# Patient Record
Sex: Male | Born: 1960 | Race: White | Hispanic: No | Marital: Married | Smoking: Never smoker
Health system: Southern US, Community
[De-identification: ages and names within clinical notes are randomized; demographics above are authoritative.]

## PROBLEM LIST (undated history)

## (undated) DIAGNOSIS — K5700 Diverticulitis of small intestine with perforation and abscess without bleeding: Secondary | ICD-10-CM

## (undated) DIAGNOSIS — J45909 Unspecified asthma, uncomplicated: Secondary | ICD-10-CM

## (undated) DIAGNOSIS — M1990 Unspecified osteoarthritis, unspecified site: Secondary | ICD-10-CM

## (undated) HISTORY — DX: Diverticulitis of small intestine with perforation and abscess without bleeding: K57.00

## (undated) HISTORY — PX: HERNIA REPAIR: SHX51

## (undated) HISTORY — PX: TONSILLECTOMY: SUR1361

---

## 2016-04-13 ENCOUNTER — Other Ambulatory Visit: Payer: Self-pay | Admitting: Orthopedic Surgery

## 2016-04-13 DIAGNOSIS — M1711 Unilateral primary osteoarthritis, right knee: Secondary | ICD-10-CM

## 2016-04-13 DIAGNOSIS — M2391 Unspecified internal derangement of right knee: Secondary | ICD-10-CM

## 2016-05-16 ENCOUNTER — Ambulatory Visit
Admission: RE | Admit: 2016-05-16 | Discharge: 2016-05-16 | Disposition: A | Discharge status: Home / Self Care | Payer: BLUE CROSS/BLUE SHIELD | Source: Ambulatory Visit | Attending: Orthopedic Surgery | Admitting: Orthopedic Surgery

## 2016-05-16 DIAGNOSIS — M1711 Unilateral primary osteoarthritis, right knee: Secondary | ICD-10-CM | POA: Diagnosis present

## 2016-05-16 DIAGNOSIS — M659 Synovitis and tenosynovitis, unspecified: Secondary | ICD-10-CM | POA: Insufficient documentation

## 2016-05-16 DIAGNOSIS — M2391 Unspecified internal derangement of right knee: Secondary | ICD-10-CM

## 2016-06-05 ENCOUNTER — Inpatient Hospital Stay
Admission: EM | Admit: 2016-06-05 | Discharge: 2016-06-08 | DRG: 392 | Disposition: A | Discharge status: Home / Self Care | Payer: BLUE CROSS/BLUE SHIELD | Attending: General Surgery | Admitting: General Surgery

## 2016-06-05 ENCOUNTER — Encounter: Payer: Self-pay | Admitting: Emergency Medicine

## 2016-06-05 ENCOUNTER — Emergency Department: Payer: BLUE CROSS/BLUE SHIELD

## 2016-06-05 DIAGNOSIS — K5720 Diverticulitis of large intestine with perforation and abscess without bleeding: Secondary | ICD-10-CM | POA: Diagnosis present

## 2016-06-05 DIAGNOSIS — J45909 Unspecified asthma, uncomplicated: Secondary | ICD-10-CM | POA: Diagnosis present

## 2016-06-05 DIAGNOSIS — Z882 Allergy status to sulfonamides status: Secondary | ICD-10-CM | POA: Diagnosis not present

## 2016-06-05 DIAGNOSIS — Z880 Allergy status to penicillin: Secondary | ICD-10-CM

## 2016-06-05 DIAGNOSIS — Z881 Allergy status to other antibiotic agents status: Secondary | ICD-10-CM

## 2016-06-05 DIAGNOSIS — R1031 Right lower quadrant pain: Secondary | ICD-10-CM | POA: Diagnosis not present

## 2016-06-05 HISTORY — DX: Unspecified asthma, uncomplicated: J45.909

## 2016-06-05 LAB — COMPREHENSIVE METABOLIC PANEL
ALBUMIN: 3.9 g/dL (ref 3.5–5.0)
ALK PHOS: 45 U/L (ref 38–126)
ALT: 18 U/L (ref 17–63)
ANION GAP: 9 (ref 5–15)
AST: 27 U/L (ref 15–41)
BILIRUBIN TOTAL: 2.5 mg/dL — AB (ref 0.3–1.2)
BUN: 18 mg/dL (ref 6–20)
CALCIUM: 8.5 mg/dL — AB (ref 8.9–10.3)
CO2: 25 mmol/L (ref 22–32)
Chloride: 104 mmol/L (ref 101–111)
Creatinine, Ser: 1.18 mg/dL (ref 0.61–1.24)
GFR calc Af Amer: >60 mL/min (ref >60)
GFR calc non Af Amer: >60 mL/min (ref >60)
GLUCOSE: 147 mg/dL — AB (ref 65–99)
Potassium: 3.6 mmol/L (ref 3.5–5.1)
Sodium: 138 mmol/L (ref 135–145)
TOTAL PROTEIN: 7.2 g/dL (ref 6.5–8.1)

## 2016-06-05 LAB — CBC
HCT: 39.5 % — ABNORMAL LOW (ref 40.0–52.0)
Hemoglobin: 14 g/dL (ref 13.0–18.0)
MCH: 32.9 pg (ref 26.0–34.0)
MCHC: 35.5 g/dL (ref 32.0–36.0)
MCV: 92.7 fL (ref 80.0–100.0)
Platelets: 217 10*3/uL (ref 150–440)
RBC: 4.26 MIL/uL — ABNORMAL LOW (ref 4.40–5.90)
RDW: 12.8 % (ref 11.5–14.5)
WBC: 13.7 10*3/uL — AB (ref 3.8–10.6)

## 2016-06-05 LAB — TROPONIN I: Troponin I: <0.03 ng/mL (ref <0.03)

## 2016-06-05 LAB — URINALYSIS, COMPLETE (UACMP) WITH MICROSCOPIC
Bacteria, UA: NONE SEEN
Bilirubin Urine: NEGATIVE
Glucose, UA: NEGATIVE mg/dL
HGB URINE DIPSTICK: NEGATIVE
Ketones, ur: 20 mg/dL — AB
Leukocytes, UA: NEGATIVE
NITRITE: NEGATIVE
PH: 6 (ref 5.0–8.0)
Protein, ur: 100 mg/dL — AB
Specific Gravity, Urine: 1.032 — ABNORMAL HIGH (ref 1.005–1.030)
Squamous Epithelial / LPF: NONE SEEN

## 2016-06-05 LAB — LACTIC ACID, PLASMA: LACTIC ACID, VENOUS: 1.4 mmol/L (ref 0.5–1.9)

## 2016-06-05 LAB — LIPASE, BLOOD: Lipase: 23 U/L (ref 11–51)

## 2016-06-05 MED ORDER — IOPAMIDOL (ISOVUE-300) INJECTION 61%
100.0000 mL | Freq: Once | INTRAVENOUS | Status: AC | PRN
  Administered 2016-06-05: 100 mL via INTRAVENOUS

## 2016-06-05 MED ORDER — CIPROFLOXACIN IN D5W 400 MG/200ML IV SOLN
400.0000 mg | Freq: Once | INTRAVENOUS | Status: AC
  Administered 2016-06-05: 400 mg via INTRAVENOUS
  Filled 2016-06-05: qty 200

## 2016-06-05 MED ORDER — DIPHENHYDRAMINE HCL 25 MG PO CAPS
25.0000 mg | ORAL_CAPSULE | Freq: Four times a day (QID) | ORAL | Status: DC | PRN
  Administered 2016-06-06: 25 mg via ORAL
  Filled 2016-06-05: qty 1

## 2016-06-05 MED ORDER — SODIUM CHLORIDE 0.9 % IV BOLUS (SEPSIS)
1000.0000 mL | Freq: Once | INTRAVENOUS | Status: AC
  Administered 2016-06-05: 1000 mL via INTRAVENOUS

## 2016-06-05 MED ORDER — MORPHINE SULFATE (PF) 4 MG/ML IV SOLN
4.0000 mg | INTRAVENOUS | Status: DC | PRN
Start: 2016-06-05 — End: 2016-06-08
  Administered 2016-06-06: 4 mg via INTRAVENOUS

## 2016-06-05 MED ORDER — SODIUM CHLORIDE 0.9 % IV BOLUS (SEPSIS): 1000.0000 mL | Freq: Once | INTRAVENOUS | Status: DC

## 2016-06-05 MED ORDER — ACETAMINOPHEN 500 MG PO TABS
1000.0000 mg | ORAL_TABLET | Freq: Once | ORAL | Status: AC
  Administered 2016-06-05: 1000 mg via ORAL
  Filled 2016-06-05: qty 2

## 2016-06-05 MED ORDER — METRONIDAZOLE IN NACL 5-0.79 MG/ML-% IV SOLN
500.0000 mg | Freq: Once | INTRAVENOUS | Status: AC
  Administered 2016-06-05: 500 mg via INTRAVENOUS
  Filled 2016-06-05: qty 100

## 2016-06-05 MED ORDER — HYDRALAZINE HCL 20 MG/ML IJ SOLN: 10.0000 mg | INTRAMUSCULAR | Status: DC | PRN

## 2016-06-05 MED ORDER — DIPHENHYDRAMINE HCL 50 MG/ML IJ SOLN
25.0000 mg | Freq: Four times a day (QID) | INTRAMUSCULAR | Status: DC | PRN
  Administered 2016-06-06: 25 mg via INTRAVENOUS
  Filled 2016-06-05: qty 1

## 2016-06-05 MED ORDER — ONDANSETRON 4 MG PO TBDP: 4.0000 mg | ORAL_TABLET | Freq: Four times a day (QID) | ORAL | Status: DC | PRN

## 2016-06-05 MED ORDER — MORPHINE SULFATE (PF) 4 MG/ML IV SOLN
4.0000 mg | INTRAVENOUS | Status: DC | PRN
  Administered 2016-06-05: 4 mg via INTRAVENOUS
  Filled 2016-06-05 (×2): qty 1

## 2016-06-05 MED ORDER — SODIUM CHLORIDE 0.9 % IV BOLUS (SEPSIS)
1000.0000 mL | Freq: Once | INTRAVENOUS | Status: AC
Start: 2016-06-05 — End: 2016-06-05
  Administered 2016-06-05: 1000 mL via INTRAVENOUS

## 2016-06-05 MED ORDER — ENOXAPARIN SODIUM 40 MG/0.4ML ~~LOC~~ SOLN
40.0000 mg | SUBCUTANEOUS | Status: DC
  Administered 2016-06-05 – 2016-06-07 (×3): 40 mg via SUBCUTANEOUS
  Filled 2016-06-05 (×3): qty 0.4

## 2016-06-05 MED ORDER — CIPROFLOXACIN IN D5W 400 MG/200ML IV SOLN
400.0000 mg | Freq: Two times a day (BID) | INTRAVENOUS | Status: DC
  Administered 2016-06-06 – 2016-06-08 (×5): 400 mg via INTRAVENOUS
  Filled 2016-06-05 (×6): qty 200

## 2016-06-05 MED ORDER — KETOROLAC TROMETHAMINE 30 MG/ML IJ SOLN
30.0000 mg | Freq: Four times a day (QID) | INTRAMUSCULAR | Status: DC | PRN
  Administered 2016-06-06 – 2016-06-07 (×3): 30 mg via INTRAVENOUS
  Filled 2016-06-05 (×3): qty 1

## 2016-06-05 MED ORDER — ONDANSETRON HCL 4 MG/2ML IJ SOLN
4.0000 mg | Freq: Four times a day (QID) | INTRAMUSCULAR | Status: DC | PRN
  Administered 2016-06-06: 4 mg via INTRAVENOUS
  Filled 2016-06-05: qty 2

## 2016-06-05 MED ORDER — ACETAMINOPHEN 10 MG/ML IV SOLN
1000.0000 mg | Freq: Four times a day (QID) | INTRAVENOUS | Status: AC | PRN
  Filled 2016-06-05: qty 100

## 2016-06-05 MED ORDER — LACTATED RINGERS IV SOLN
INTRAVENOUS | Status: DC
  Administered 2016-06-05 – 2016-06-08 (×7): via INTRAVENOUS

## 2016-06-05 MED ORDER — PROMETHAZINE HCL 25 MG/ML IJ SOLN
12.5000 mg | Freq: Once | INTRAMUSCULAR | Status: DC
Start: 2016-06-05 — End: 2016-06-08

## 2016-06-05 MED ORDER — METRONIDAZOLE IN NACL 5-0.79 MG/ML-% IV SOLN
500.0000 mg | Freq: Three times a day (TID) | INTRAVENOUS | Status: DC
  Administered 2016-06-06 – 2016-06-08 (×8): 500 mg via INTRAVENOUS
  Filled 2016-06-05 (×9): qty 100

## 2016-06-05 NOTE — H&P (Signed)
Patient ID: DE KOPKA, male   DOB: 11/10/1960, 56 y.o.   MRN: [id number]  CC: Abdominal pain  HPI Peter Hicks is a 56 y.o. male presents to the ER today with a 2 day history of abdominal pain. The pain is progressively worsened during that time and he started to run a fever today which prompted him to seek medical attention. He's never had anything like this before. The pain is always been in his lower abdomen. He denies any nausea, vomiting, chest pain, shortness of breath, diarrhea, constipation. The pain comes in waves and is described as a warm ache to his left lower quadrant. Patient is otherwise in his usual state of excellent health.  HPI  Past Medical History:  Diagnosis Date  . Asthma     Past Surgical History:  Procedure Laterality Date  . HERNIA REPAIR      Family history: Grandmother with pancreatic cancer. No known history of heart disease or diabetes.  Social History Social History  Substance Use Topics  . Smoking status: Never Smoker  . Smokeless tobacco: Never Used  . Alcohol use 2.4 oz/week    4 Cans of beer per week    Allergies  Allergen Reactions  . Cleocin [Clindamycin Hcl]   . Penicillins Hives    Has patient had a PCN reaction causing immediate rash, facial/tongue/throat swelling, SOB or lightheadedness with hypotension: No Has patient had a PCN reaction causing severe rash involving mucus membranes or skin necrosis: {No Has patient had a PCN reaction that required hospitalization No Has patient had a PCN reaction occurring within the last 10 years: No If all of the above answers are "NO", then may proceed with Cephalosporin use.   . Sulfa Antibiotics     Current Facility-Administered Medications  Medication Dose Route Frequency Provider Last Rate Last Dose  . ciprofloxacin (CIPRO) IVPB 400 mg  400 mg Intravenous Once Merlyn Lot, MD 200 mL/hr at 06/05/16 2008 400 mg at 06/05/16 2008  . metroNIDAZOLE (FLAGYL) IVPB 500 mg  500 mg  Intravenous Once Merlyn Lot, MD 100 mL/hr at 06/05/16 2009 500 mg at 06/05/16 2009  . morphine 4 MG/ML injection 4 mg  4 mg Intravenous Q3H PRN Merlyn Lot, MD      . promethazine (PHENERGAN) injection 12.5 mg  12.5 mg Intravenous Once Merlyn Lot, MD      . sodium chloride 0.9 % bolus 1,000 mL  1,000 mL Intravenous Once Merlyn Lot, MD 2,000 mL/hr at 06/05/16 2008 1,000 mL at 06/05/16 2008   And  . sodium chloride 0.9 % bolus 1,000 mL  1,000 mL Intravenous Once Merlyn Lot, MD       No current outpatient prescriptions on file.     Review of Systems A Multi-point review of systems was asked and was negative except for the findings I found in the history of present illness   Physical Exam Blood pressure 130/84, pulse 100, temperature (!) 103.2 F (39.6 C), temperature source Oral, resp. rate 18, height 6' (1.829 m), weight 96.2 kg (212 lb), SpO2 97 %. CONSTITUTIONAL: Resting in bed in no acute distress. EYES: Pupils are equal, round, and reactive to light, Sclera are non-icteric. EARS, NOSE, MOUTH AND THROAT: The oropharynx is clear. The oral mucosa is pink and moist. Hearing is intact to voice. LYMPH NODES:  Lymph nodes in the neck are normal. RESPIRATORY:  Lungs are clear. There is normal respiratory effort, with equal breath sounds bilaterally, and without pathologic use of accessory muscles.  CARDIOVASCULAR: Heart is regular without murmurs, gallops, or rubs. GI: The abdomen is soft, tender to palpation left lower quadrant, and nondistended. There are no palpable masses. There is no hepatosplenomegaly. There are normal bowel sounds in all quadrants. No rebound or guarding on exam GU: Rectal deferred.   MUSCULOSKELETAL: Normal muscle strength and tone. No cyanosis or edema.   SKIN: Turgor is good and there are no pathologic skin lesions or ulcers. NEUROLOGIC: Motor and sensation is grossly normal. Cranial nerves are grossly intact. PSYCH:  Oriented to person,  place and time. Affect is normal.  Data Reviewed Images and labs reviewed, labs significant for a leukocytosis of 13.7, CT scan shows evidence of active sigmoid diverticulitis with a small amount of air adjacent to the sigmoid colon consistent with a microperforation. Thickening of the small bowel adjacent to this that is likely reactive. No evidence of abscess. I have personally reviewed the patient's imaging, laboratory findings and medical records.    Assessment    Diverticulitis with microperforation    Plan    56 year old male with sigmoid diverticulitis with microperforation. Discussed the diagnosis in detail with the patient and his wife. Plan for admission, nothing by mouth, IV fluids, IV antibiotics, serial exams. Discussed with the patient of the majority of time patient's improve on antibiotics and are able to avoid an urgent operation. However, given his febrile state and microperforation there is a chance he could require an urgent operation. Patient voiced understanding and accepts the plan for admission.     Time spent with the patient was 50 minutes, with more than 50% of the time spent in face-to-face education, counseling and care coordination.     Clayburn Pert, MD [facility]  [facility] [phone] [facility] [phone] 06/05/2016, 8:19 PM

## 2016-06-05 NOTE — ED Provider Notes (Signed)
[facility] Emergency Department Provider Note    First MD Initiated Contact with Patient 06/05/16 1816     (approximate)  I have reviewed the triage vital signs and the nursing notes.   HISTORY  Chief Complaint Abdominal Pain    HPI Peter Hicks is a 56 y.o. male who presents with 2 days of worsening lower abdominal pain since her fever and nausea and anorexia. Patient states the current pain is 3 out of 10 in severity. States it radiates across his suprapubic region.  No previous abd surgeries. Is not on any blood thinners. Denies any history of heart disease. No pulmonary disease. States he does have a headache.   Past Medical History:  Diagnosis Date  . Asthma    History reviewed. No pertinent family history. Past Surgical History:  Procedure Laterality Date  . HERNIA REPAIR     Patient Active Problem List   Diagnosis Date Noted  . Diverticulitis of large intestine with perforation without abscess or bleeding 06/05/2016      Prior to Admission medications   Medication Sig Start Date End Date Taking? Authorizing Provider  ibuprofen (ADVIL,MOTRIN) 200 MG tablet Take 200 mg by mouth every 6 (six) hours as needed.   Yes Historical Provider, MD  meloxicam (MOBIC) 15 MG tablet Take 15 mg by mouth daily. 04/13/16  Yes Historical Provider, MD  traMADol (ULTRAM) 50 MG tablet Take 50 mg by mouth every 6 (six) hours as needed for pain. 05/05/16  Yes Historical Provider, MD    Allergies Cleocin [clindamycin hcl]; Penicillins; and Sulfa antibiotics    Social History Social History  Substance Use Topics  . Smoking status: Never Smoker  . Smokeless tobacco: Never Used  . Alcohol use 2.4 oz/week    4 Cans of beer per week    Review of Systems Patient denies headaches, rhinorrhea, blurry vision, numbness, shortness of breath, chest pain, edema, cough, abdominal pain, nausea, vomiting, diarrhea, dysuria, fevers, rashes or hallucinations  unless otherwise stated above in HPI. ____________________________________________   PHYSICAL EXAM:  VITAL SIGNS: Vitals:   06/05/16 1930 06/05/16 2000  BP: 132/77 130/84  Pulse: 82 100  Resp:    Temp:      Constitutional: Alert and oriented. Ill appearing Eyes: Conjunctivae are normal. PERRL. EOMI. Head: Atraumatic. Nose: No congestion/rhinnorhea. Mouth/Throat: Mucous membranes are moist.  Oropharynx non-erythematous. Neck: No stridor. Painless ROM. No cervical spine tenderness to palpation Hematological/Lymphatic/Immunilogical: No cervical lymphadenopathy. Cardiovascular: Normal rate, regular rhythm. Grossly normal heart sounds.  Good peripheral circulation. Respiratory: Normal respiratory effort.  No retractions. Lungs CTAB. Gastrointestinal: Soft  With generalized peritonitis to suprapubic region, + rebound tenderness in RLQ.  No hernia,  Genitourinary:  Musculoskeletal: No lower extremity tenderness nor edema.  No joint effusions. Neurologic:  Normal speech and language. No gross focal neurologic deficits are appreciated. No gait instability. Skin:  Skin is warm, dry and intact. No rash noted. Psychiatric: Mood and affect are normal. Speech and behavior are normal.  ____________________________________________   LABS (all labs ordered are listed, but only abnormal results are displayed)  Results for orders placed or performed during the hospital encounter of 06/05/16 (from the past 24 hour(s))  Lipase, blood     Status: None   Collection Time: 06/05/16  6:16 PM  Result Value Ref Range   Lipase 23 11 - 51 U/L  Comprehensive metabolic panel     Status: Abnormal   Collection Time: 06/05/16  6:16 PM  Result Value Ref Range  Sodium 138 135 - 145 mmol/L   Potassium 3.6 3.5 - 5.1 mmol/L   Chloride 104 101 - 111 mmol/L   CO2 25 22 - 32 mmol/L   Glucose, Bld 147 (H) 65 - 99 mg/dL   BUN 18 6 - 20 mg/dL   Creatinine, Ser 1.18 0.61 - 1.24 mg/dL   Calcium 8.5 (L) 8.9 - 10.3  mg/dL   Total Protein 7.2 6.5 - 8.1 g/dL   Albumin 3.9 3.5 - 5.0 g/dL   AST 27 15 - 41 U/L   ALT 18 17 - 63 U/L   Alkaline Phosphatase 45 38 - 126 U/L   Total Bilirubin 2.5 (H) 0.3 - 1.2 mg/dL   GFR calc non Af Amer >60 >60 mL/min   GFR calc Af Amer >60 >60 mL/min   Anion gap 9 5 - 15  CBC     Status: Abnormal   Collection Time: 06/05/16  6:16 PM  Result Value Ref Range   WBC 13.7 (H) 3.8 - 10.6 K/uL   RBC 4.26 (L) 4.40 - 5.90 MIL/uL   Hemoglobin 14.0 13.0 - 18.0 g/dL   HCT 39.5 (L) 40.0 - 52.0 %   MCV 92.7 80.0 - 100.0 fL   MCH 32.9 26.0 - 34.0 pg   MCHC 35.5 32.0 - 36.0 g/dL   RDW 12.8 11.5 - 14.5 %   Platelets 217 150 - 440 K/uL  Troponin I     Status: None   Collection Time: 06/05/16  6:16 PM  Result Value Ref Range   Troponin I <0.03 <0.03 ng/mL  Lactic acid, plasma     Status: None   Collection Time: 06/05/16  6:26 PM  Result Value Ref Range   Lactic Acid, Venous 1.4 0.5 - 1.9 mmol/L   ____________________________________________  EKG ____________________________________________  RADIOLOGY  I personally reviewed all radiographic images ordered to evaluate for the above acute complaints and reviewed radiology reports and findings.  These findings were personally discussed with the patient.  Please see medical record for radiology report.  ____________________________________________   PROCEDURES  Procedure(s) performed:  Procedures    Critical Care performed: no ____________________________________________   INITIAL IMPRESSION / ASSESSMENT AND PLAN / ED COURSE  Pertinent labs & imaging results that were available during my care of the patient were reviewed by me and considered in my medical decision making (see chart for details).  DDX: .appy, abscess, perf, stone, pyelo, bacteremia  Peter Hicks is a 56 y.o. who presents to the ED with Abdominal pain and fever with tachycardia as described above.  Based on his peritoneal abdomen upright  abdominal x-ray ordered to evaluate for perforated viscous. There is no evidence of free air. Therefore in the setting of his acute leukocytosis with concern for appendicitis, diverticulitis or abscess CT imaging ordered to evaluate.  The patient will be placed on continuous pulse oximetry and telemetry for monitoring.  Laboratory evaluation will be sent to evaluate for the above complaints.     Clinical Course as of Jun 06 2031  Molli Knock Jun 05, 2016  2008 CT imaging shows evidence of acute diverticulitis without evidence of abscess. There is evidence of microperforation.  Given his leukocytosis with peritonitis and sepsis I do feel the patient will require admission to the hospital. I spoke with Dr. Adonis Huguenin who kindly agrees to admit patient for further evaluation and management.  Have discussed with the patient and available family all diagnostics and treatments performed thus far and all questions were answered to  the best of my ability. The patient demonstrates understanding and agreement with plan.   [PR]    Clinical Course User Index [PR] Peter Lot, MD     ____________________________________________   FINAL CLINICAL IMPRESSION(S) / ED DIAGNOSES  Final diagnoses:  Acute abdominal pain in right lower quadrant  Diverticulitis of large intestine with perforation without abscess or bleeding      NEW MEDICATIONS STARTED DURING THIS VISIT:  New Prescriptions   No medications on file     Note:  This document was prepared using Dragon voice recognition software and may include unintentional dictation errors.    Peter Lot, MD 06/05/16 2034

## 2016-06-05 NOTE — ED Triage Notes (Signed)
Pt via ems from PCP office with lower abdominal pain since Saturday. He went to pcp office today and had ultrasound. He was told her had a ruptured appendix or almost ruptured appendix. Pt states his pain is 2/10. Pt alert & oriented with NAD noted.

## 2016-06-05 NOTE — ED Notes (Signed)
AAOx3.  Skin warm and dry.  Denies C/O pain or nausea.  Continue to monitor.

## 2016-06-06 LAB — CBC
HEMATOCRIT: 34.7 % — AB (ref 40.0–52.0)
Hemoglobin: 12.2 g/dL — ABNORMAL LOW (ref 13.0–18.0)
MCH: 32.7 pg (ref 26.0–34.0)
MCHC: 35.2 g/dL (ref 32.0–36.0)
MCV: 92.9 fL (ref 80.0–100.0)
PLATELETS: 174 10*3/uL (ref 150–440)
RBC: 3.74 MIL/uL — ABNORMAL LOW (ref 4.40–5.90)
RDW: 12.5 % (ref 11.5–14.5)
WBC: 10.4 10*3/uL (ref 3.8–10.6)

## 2016-06-06 LAB — BASIC METABOLIC PANEL
ANION GAP: 5 (ref 5–15)
BUN: 15 mg/dL (ref 6–20)
CALCIUM: 7.8 mg/dL — AB (ref 8.9–10.3)
CO2: 24 mmol/L (ref 22–32)
CREATININE: 0.94 mg/dL (ref 0.61–1.24)
Chloride: 111 mmol/L (ref 101–111)
GFR calc Af Amer: >60 mL/min (ref >60)
GLUCOSE: 113 mg/dL — AB (ref 65–99)
Potassium: 3.6 mmol/L (ref 3.5–5.1)
Sodium: 140 mmol/L (ref 135–145)

## 2016-06-06 MED ORDER — ACETAMINOPHEN 325 MG PO TABS
650.0000 mg | ORAL_TABLET | ORAL | Status: DC | PRN
  Administered 2016-06-06: 650 mg via ORAL
  Filled 2016-06-06: qty 2

## 2016-06-06 NOTE — Progress Notes (Signed)
CC: Perforated diverticulitis Subjective: This a patient with a history of perforated diverticulitis. He was admitted to the hospital last night. He has no improvement but no worsening this morning no nausea vomiting no fevers or chills points to the left lower quadrant as source of his pain. Passing gas  Objective: Vital signs in last 24 hours: Temp:  [98 F (36.7 C)-103.2 F (39.6 C)] 98 F (36.7 C) (01/30 0513) Pulse Rate:  [62-118] 66 (01/30 0513) Resp:  [17-28] 18 (01/30 0513) BP: (112-132)/(60-84) 112/67 (01/30 0513) SpO2:  [96 %-100 %] 98 % (01/30 0513) Weight:  [212 lb (96.2 kg)-213 lb 3.2 oz (96.7 kg)] 213 lb 3.2 oz (96.7 kg) (01/29 2204) Last BM Date: 06/05/16  Intake/Output from previous day: 01/29 0701 - 01/30 0700 In: 861 [I.V.:761; IV Piggyback:100] Out: 450 [Urine:450] Intake/Output this shift: No intake/output data recorded.  Physical exam:  Afebrile vital signs are stable Awake alert and oriented Abdomen is soft and minimally distended moderately tender in the left lower quadrant with some guarding but no rebound or percussion tenderness. Calves are nontender  Lab Results: CBC   Recent Labs  06/05/16 1816 06/06/16 0454  WBC 13.7* 10.4  HGB 14.0 12.2*  HCT 39.5* 34.7*  PLT 217 174   BMET  Recent Labs  06/05/16 1816 06/06/16 0454  NA 138 140  K 3.6 3.6  CL 104 111  CO2 25 24  GLUCOSE 147* 113*  BUN 18 15  CREATININE 1.18 0.94  CALCIUM 8.5* 7.8*   PT/INR No results for input(s): LABPROT, INR in the last 72 hours. ABG No results for input(s): PHART, HCO3 in the last 72 hours.  Invalid input(s): PCO2, PO2  Studies/Results: Dg Abdomen 1 View  Result Date: 06/05/2016 CLINICAL DATA:  Lower abdominal pain since Saturday. Recent diagnosis of appendicitis. EXAM: ABDOMEN - 1 VIEW COMPARISON:  None. FINDINGS: Single upright KUB is provided. Lower abdomen and pelvis is excluded. Visualized bowel gas pattern is nonobstructive. No evidence of soft  tissue mass or abnormal fluid collection is seen. No evidence of free intraperitoneal air. Lung bases appear clear. No acute or suspicious osseous finding. Mild scoliosis of the thoracolumbar spine. IMPRESSION: 1. No evidence of acute intra-abdominal abnormality. Nonobstructive bowel gas pattern. 2. Lower abdomen and pelvis is excluded on this plain film examination. Electronically Signed   By: Franki Cabot M.D.   On: 06/05/2016 18:57   Ct Abdomen Pelvis W Contrast  Result Date: 06/05/2016 CLINICAL DATA:  Acute lower abdominal pain since Saturday. EXAM: CT ABDOMEN AND PELVIS WITH CONTRAST TECHNIQUE: Multidetector CT imaging of the abdomen and pelvis was performed using the standard protocol following bolus administration of intravenous contrast. CONTRAST:  118mL ISOVUE-300 IOPAMIDOL (ISOVUE-300) INJECTION 61% COMPARISON:  None. FINDINGS: Lower chest: Minor dependent basilar atelectasis. Mild cardiomegaly. No pericardial or pleural effusion. Small hiatal hernia. Degenerative changes of the lower thoracic spine. Hepatobiliary: No focal liver abnormality is seen. No gallstones, gallbladder wall thickening, or biliary dilatation. Pancreas: Unremarkable. No pancreatic ductal dilatation or surrounding inflammatory changes. Spleen: Normal in size without focal abnormality. Adrenals/Urinary Tract: Adrenal glands are unremarkable. Kidneys are normal, without renal calculi, focal lesion, or hydronephrosis. Bladder is unremarkable. Stomach/Bowel: Negative for significant dilatation, obstruction pattern or abscess. In the pelvis, there is midline lower abdominopelvic strandy inflammation/edema about the sigmoid colon and the adjacent terminal ileum. Diverticulosis is evident of the sigmoid. There is a tiny focus of extra luminal air suspected, image 72 adjacent to the sigmoid colon and terminal ileum. Appearance is  compatible with sigmoid diverticulitis and suspected micro perforation. Adjacent terminal ileum demonstrates  inflammation / mild wall thickening, suspect reactive enteritis. Portions of the appendix are demonstrated along the right iliac vessels appearing unremarkable. Trace dependent pelvic free fluid inferiorly, image 84. Vascular/Lymphatic: Minor aortic atherosclerosis and tortuosity. No acute vascular process. No adenopathy. Reproductive: Prostate calcifications noted. Seminal vesicles prostate normal in size. No acute finding. Other: No inguinal abnormality. Negative for hernia. Intact abdominal wall. Musculoskeletal: Degenerative changes of the spine diffusely. No acute osseous finding. IMPRESSION: Acute sigmoid diverticulitis with a small adjacent micro perforation suspected. Mild wall thickening/ prominence of the terminal ileum, suspect reactive enteritis from the adjacent inflammation. Trace pelvic free fluid related to the inflammatory process. Negative for abscess, or obstruction Electronically Signed   By: Jerilynn Mages.  Shick M.D.   On: 06/05/2016 19:36    Anti-infectives: Anti-infectives    Start     Dose/Rate Route Frequency Ordered Stop   06/06/16 0800  ciprofloxacin (CIPRO) IVPB 400 mg     400 mg 200 mL/hr over 60 Minutes Intravenous Every 12 hours 06/05/16 2212     06/06/16 0400  metroNIDAZOLE (FLAGYL) IVPB 500 mg     500 mg 100 mL/hr over 60 Minutes Intravenous Every 8 hours 06/05/16 2210     06/05/16 1945  metroNIDAZOLE (FLAGYL) IVPB 500 mg     500 mg 100 mL/hr over 60 Minutes Intravenous  Once 06/05/16 1941 06/05/16 2109   06/05/16 1945  ciprofloxacin (CIPRO) IVPB 400 mg     400 mg 200 mL/hr over 60 Minutes Intravenous  Once 06/05/16 1941 06/05/16 2108      Assessment/Plan:  Perforated diverticulitis. He is being treated with IV antibiotics. He's had of 12 hours of therapy and has not improved considerably but will likely improve on IV antibiotics. I discussed the evolution of his care and plan today. Will reexamine.  Florene Glen, MD, FACS  06/06/2016

## 2016-06-06 NOTE — Progress Notes (Signed)
Notified dr.byrnett of pt elevated temp. Acknowledged and orders placed.

## 2016-06-07 LAB — CBC WITH DIFFERENTIAL/PLATELET
BASOS ABS: 0 10*3/uL (ref 0–0.1)
BASOS PCT: 1 %
EOS ABS: 0.1 10*3/uL (ref 0–0.7)
EOS PCT: 1 %
HCT: 36 % — ABNORMAL LOW (ref 40.0–52.0)
Hemoglobin: 12.4 g/dL — ABNORMAL LOW (ref 13.0–18.0)
Lymphocytes Relative: 19 %
Lymphs Abs: 1.7 10*3/uL (ref 1.0–3.6)
MCH: 32.3 pg (ref 26.0–34.0)
MCHC: 34.5 g/dL (ref 32.0–36.0)
MCV: 93.7 fL (ref 80.0–100.0)
Monocytes Absolute: 0.9 10*3/uL (ref 0.2–1.0)
Monocytes Relative: 10 %
Neutro Abs: 6.3 10*3/uL (ref 1.4–6.5)
Neutrophils Relative %: 69 %
PLATELETS: 195 10*3/uL (ref 150–440)
RBC: 3.84 MIL/uL — AB (ref 4.40–5.90)
RDW: 12.7 % (ref 11.5–14.5)
WBC: 9.1 10*3/uL (ref 3.8–10.6)

## 2016-06-07 NOTE — Progress Notes (Signed)
CC: Left lower quadrant abdominal pain Subjective: Patient states that he is slightly better today no nausea vomiting no fevers or chills he is passing some gas. He points to the left lower quadrant pain as a side of his discomfort.  Objective: Vital signs in last 24 hours: Temp:  [98.2 F (36.8 C)-101.8 F (38.8 C)] 98.2 F (36.8 C) (01/31 0838) Pulse Rate:  [65-87] 70 (01/31 0838) Resp:  [18] 18 (01/31 0543) BP: (128-145)/(79-88) 132/81 (01/31 0838) SpO2:  [99 %-100 %] 100 % (01/31 0838) Last BM Date: 06/07/16  Intake/Output from previous day: 01/30 0701 - 01/31 0700 In: 6325.3 [P.O.:920; I.V.:2505.3; IV Piggyback:2900] Out: 650 [Urine:650] Intake/Output this shift: Total I/O In: 656.3 [I.V.:456.3; IV Piggyback:200] Out: -   Physical exam:  Febrile last night. A fever since other vital signs are stable Awake alert and oriented Abdomen is slightly distended tender in the left lower quadrant with some guarding but no rebound or percussion tenderness. Calves are nontender  Lab Results: CBC   Recent Labs  06/06/16 0454 06/07/16 0505  WBC 10.4 9.1  HGB 12.2* 12.4*  HCT 34.7* 36.0*  PLT 174 195   BMET  Recent Labs  06/05/16 1816 06/06/16 0454  NA 138 140  K 3.6 3.6  CL 104 111  CO2 25 24  GLUCOSE 147* 113*  BUN 18 15  CREATININE 1.18 0.94  CALCIUM 8.5* 7.8*   PT/INR No results for input(s): LABPROT, INR in the last 72 hours. ABG No results for input(s): PHART, HCO3 in the last 72 hours.  Invalid input(s): PCO2, PO2  Studies/Results: Dg Abdomen 1 View  Result Date: 06/05/2016 CLINICAL DATA:  Lower abdominal pain since Saturday. Recent diagnosis of appendicitis. EXAM: ABDOMEN - 1 VIEW COMPARISON:  None. FINDINGS: Single upright KUB is provided. Lower abdomen and pelvis is excluded. Visualized bowel gas pattern is nonobstructive. No evidence of soft tissue mass or abnormal fluid collection is seen. No evidence of free intraperitoneal air. Lung bases  appear clear. No acute or suspicious osseous finding. Mild scoliosis of the thoracolumbar spine. IMPRESSION: 1. No evidence of acute intra-abdominal abnormality. Nonobstructive bowel gas pattern. 2. Lower abdomen and pelvis is excluded on this plain film examination. Electronically Signed   By: Franki Cabot M.D.   On: 06/05/2016 18:57   Ct Abdomen Pelvis W Contrast  Result Date: 06/05/2016 CLINICAL DATA:  Acute lower abdominal pain since Saturday. EXAM: CT ABDOMEN AND PELVIS WITH CONTRAST TECHNIQUE: Multidetector CT imaging of the abdomen and pelvis was performed using the standard protocol following bolus administration of intravenous contrast. CONTRAST:  141mL ISOVUE-300 IOPAMIDOL (ISOVUE-300) INJECTION 61% COMPARISON:  None. FINDINGS: Lower chest: Minor dependent basilar atelectasis. Mild cardiomegaly. No pericardial or pleural effusion. Small hiatal hernia. Degenerative changes of the lower thoracic spine. Hepatobiliary: No focal liver abnormality is seen. No gallstones, gallbladder wall thickening, or biliary dilatation. Pancreas: Unremarkable. No pancreatic ductal dilatation or surrounding inflammatory changes. Spleen: Normal in size without focal abnormality. Adrenals/Urinary Tract: Adrenal glands are unremarkable. Kidneys are normal, without renal calculi, focal lesion, or hydronephrosis. Bladder is unremarkable. Stomach/Bowel: Negative for significant dilatation, obstruction pattern or abscess. In the pelvis, there is midline lower abdominopelvic strandy inflammation/edema about the sigmoid colon and the adjacent terminal ileum. Diverticulosis is evident of the sigmoid. There is a tiny focus of extra luminal air suspected, image 72 adjacent to the sigmoid colon and terminal ileum. Appearance is compatible with sigmoid diverticulitis and suspected micro perforation. Adjacent terminal ileum demonstrates inflammation / mild wall thickening, suspect  reactive enteritis. Portions of the appendix are  demonstrated along the right iliac vessels appearing unremarkable. Trace dependent pelvic free fluid inferiorly, image 84. Vascular/Lymphatic: Minor aortic atherosclerosis and tortuosity. No acute vascular process. No adenopathy. Reproductive: Prostate calcifications noted. Seminal vesicles prostate normal in size. No acute finding. Other: No inguinal abnormality. Negative for hernia. Intact abdominal wall. Musculoskeletal: Degenerative changes of the spine diffusely. No acute osseous finding. IMPRESSION: Acute sigmoid diverticulitis with a small adjacent micro perforation suspected. Mild wall thickening/ prominence of the terminal ileum, suspect reactive enteritis from the adjacent inflammation. Trace pelvic free fluid related to the inflammatory process. Negative for abscess, or obstruction Electronically Signed   By: Jerilynn Mages.  Shick M.D.   On: 06/05/2016 19:36    Anti-infectives: Anti-infectives    Start     Dose/Rate Route Frequency Ordered Stop   06/06/16 0800  ciprofloxacin (CIPRO) IVPB 400 mg     400 mg 200 mL/hr over 60 Minutes Intravenous Every 12 hours 06/05/16 2212     06/06/16 0400  metroNIDAZOLE (FLAGYL) IVPB 500 mg     500 mg 100 mL/hr over 60 Minutes Intravenous Every 8 hours 06/05/16 2210     06/05/16 1945  metroNIDAZOLE (FLAGYL) IVPB 500 mg     500 mg 100 mL/hr over 60 Minutes Intravenous  Once 06/05/16 1941 06/05/16 2109   06/05/16 1945  ciprofloxacin (CIPRO) IVPB 400 mg     400 mg 200 mL/hr over 60 Minutes Intravenous  Once 06/05/16 1941 06/05/16 2108      Assessment/Plan:  White blood cell count is improved at 9. His abdominal exam is unchanged from yesterday but he subjectively feels a little bit better. I believe that it may take another 24 hours for the antibiotics to make much difference in this patient I discussed with he and his family member the rationale for this approach and the potential for surgical intervention should he not improve but the need for a colostomy bag  was discussed as well which she is reluctant to undertake. Continued improvement in the form of white blood cell count lack of fever and improvement in his pain would dictate the need for or the ability to switch to oral antibiotics but that is not on the horizon yet.  Florene Glen, MD, FACS  06/07/2016

## 2016-06-08 MED ORDER — CIPROFLOXACIN HCL 500 MG PO TABS: 500.0000 mg | ORAL_TABLET | Freq: Two times a day (BID) | ORAL | 1 refills | Status: DC

## 2016-06-08 MED ORDER — METRONIDAZOLE 500 MG PO TABS: 500.0000 mg | ORAL_TABLET | Freq: Three times a day (TID) | ORAL | 1 refills | Status: DC

## 2016-06-08 MED ORDER — HYDROCODONE-ACETAMINOPHEN 5-300 MG PO TABS: 1.0000 | ORAL_TABLET | ORAL | 0 refills | Status: DC | PRN

## 2016-06-08 NOTE — Discharge Instructions (Signed)
Diverticulitis °Diverticulitis is when small pockets that have formed in your colon (large intestine) become infected or swollen. °Follow these instructions at home: °· Follow your doctor's instructions. °· Follow a special diet if told by your doctor. °· When you feel better, your doctor may tell you to change your diet. You may be told to eat a lot of fiber. Fruits and vegetables are good sources of fiber. Fiber makes it easier to poop (have bowel movements). °· Take supplements or probiotics as told by your doctor. °· Only take medicines as told by your doctor. °· Keep all follow-up visits with your doctor. °Contact a doctor if: °· Your pain does not get better. °· You have a hard time eating food. °· You are not pooping like normal. °Get help right away if: °· Your pain gets worse. °· Your problems do not get better. °· Your problems suddenly get worse. °· You have a fever. °· You keep throwing up (vomiting). °· You have bloody or black, tarry poop (stool). °This information is not intended to replace advice given to you by your health care provider. Make sure you discuss any questions you have with your health care provider. °Document Released: 10/11/2007 Document Revised: 09/30/2015 Document Reviewed: 03/19/2013 °Elsevier Interactive Patient Education © 2017 [employer] ° °

## 2016-06-08 NOTE — Progress Notes (Signed)
CC: Q diverticulitis Subjective: This a patient with acute diverticulitis. He states he is feeling better today no nausea vomiting less abdominal pain no fevers or chills is to advance diet  Objective: Vital signs in last 24 hours: Temp:  [98 F (36.7 C)-98.5 F (36.9 C)] 98 F (36.7 C) (02/01 0700) Pulse Rate:  [62-73] 69 (02/01 0700) Resp:  [17-18] 17 (02/01 0700) BP: (130-140)/(75-87) 135/75 (02/01 0700) SpO2:  [97 %-100 %] 98 % (02/01 0700) Last BM Date: 06/07/16  Intake/Output from previous day: 01/31 0701 - 02/01 0700 In: 4120.3 [P.O.:480; I.V.:3040.3; IV Piggyback:600] Out: 200 [Urine:200] Intake/Output this shift: No intake/output data recorded.  Physical exam:  Vital signs are stable afebrile patient appears quite comfortable abdomen is soft and much less tender no peritoneal signs nontender calves no icterus no jaundice  Lab Results: CBC   Recent Labs  06/06/16 0454 06/07/16 0505  WBC 10.4 9.1  HGB 12.2* 12.4*  HCT 34.7* 36.0*  PLT 174 195   BMET  Recent Labs  06/05/16 1816 06/06/16 0454  NA 138 140  K 3.6 3.6  CL 104 111  CO2 25 24  GLUCOSE 147* 113*  BUN 18 15  CREATININE 1.18 0.94  CALCIUM 8.5* 7.8*   PT/INR No results for input(s): LABPROT, INR in the last 72 hours. ABG No results for input(s): PHART, HCO3 in the last 72 hours.  Invalid input(s): PCO2, PO2  Studies/Results: No results found.  Anti-infectives: Anti-infectives    Start     Dose/Rate Route Frequency Ordered Stop   06/06/16 0800  ciprofloxacin (CIPRO) IVPB 400 mg     400 mg 200 mL/hr over 60 Minutes Intravenous Every 12 hours 06/05/16 2212     06/06/16 0400  metroNIDAZOLE (FLAGYL) IVPB 500 mg     500 mg 100 mL/hr over 60 Minutes Intravenous Every 8 hours 06/05/16 2210     06/05/16 1945  metroNIDAZOLE (FLAGYL) IVPB 500 mg     500 mg 100 mL/hr over 60 Minutes Intravenous  Once 06/05/16 1941 06/05/16 2109   06/05/16 1945  ciprofloxacin (CIPRO) IVPB 400 mg     400  mg 200 mL/hr over 60 Minutes Intravenous  Once 06/05/16 1941 06/05/16 2108      Assessment/Plan:  We'll advance diet to full liquids and consider discharge later today on oral antibiotics if he tolerates that.  Florene Glen, MD, FACS  06/08/2016

## 2016-06-08 NOTE — Progress Notes (Signed)
Discharged patient home with his wife, he was in stable condition, reviewed discharge instructions, medications and accessing my chart with patient and wife.

## 2016-06-08 NOTE — Discharge Summary (Signed)
Physician Discharge Summary  Patient ID: GARVIE GREG MRN: [id number] DOB/AGE: 1960-12-23 56 y.o.  Admit date: 06/05/2016 Discharge date: 06/08/2016   Discharge Diagnoses:  Active Problems:   Diverticulitis of large intestine with perforation without abscess or bleeding   Procedures:None  Hospital Course: This patient admitted the hospital with diagnosis of acute diverticulitis with microperforation he was started on IV antibiotics and treated conservatively. His pain is improved his tenderness is much improved his white blood cell count is normal and he is afebrile he is tolerating a full liquid diet and wishes to be discharged he will be discharged on oral antibiotics and analgesics to follow up in our office in 10 days he is instructed to return to the emergency room should he have worsening pain at any time or fevers.  Consults: None  Disposition: Final discharge disposition not confirmed   Allergies as of 06/08/2016      Reactions   Cleocin [clindamycin Hcl]    Penicillins Hives   Has patient had a PCN reaction causing immediate rash, facial/tongue/throat swelling, SOB or lightheadedness with hypotension: No Has patient had a PCN reaction causing severe rash involving mucus membranes or skin necrosis: {No Has patient had a PCN reaction that required hospitalization No Has patient had a PCN reaction occurring within the last 10 years: No If all of the above answers are "NO", then may proceed with Cephalosporin use.   Sulfa Antibiotics       Medication List    TAKE these medications   ciprofloxacin 500 MG tablet Commonly known as:  CIPRO Take 1 tablet (500 mg total) by mouth 2 (two) times daily.   Hydrocodone-Acetaminophen 5-300 MG Tabs Commonly known as:  VICODIN Take 1 tablet by mouth every 4 (four) hours as needed.   ibuprofen 200 MG tablet Commonly known as:  ADVIL,MOTRIN Take 200 mg by mouth every 6 (six) hours as needed.   meloxicam 15 MG tablet Commonly  known as:  MOBIC Take 15 mg by mouth daily.   metroNIDAZOLE 500 MG tablet Commonly known as:  FLAGYL Take 1 tablet (500 mg total) by mouth 3 (three) times daily.   traMADol 50 MG tablet Commonly known as:  ULTRAM Take 50 mg by mouth every 6 (six) hours as needed for pain.        Florene Glen, MD, FACS

## 2016-06-10 LAB — CULTURE, BLOOD (ROUTINE X 2)
CULTURE: NO GROWTH
Culture: NO GROWTH

## 2016-06-14 ENCOUNTER — Telehealth: Payer: Self-pay

## 2016-06-14 NOTE — Telephone Encounter (Signed)
Called patient back in reference to his concern. Patient stated that he has a bit of abdominal pain, chills and a low grade fever that started this AM. Hs stated that he had taken Tylenol for the abdominal pain and fever and it has helped with both. Patient denies diarrhea, constipation, nausea and vomiting. He also stated that he never took his prescription for narcotics to his pharmacy. I recommended for him to take Ibuprofen 800 MG every 6 hours if he continued to have the abdominal pain and chills. However, if he developed nausea, vomiting, diarrhea, fever more than 101.5 and if his abdominal pain getting worse, to please go to the emergency room.  I also told him that if he had further questions, to please give [place] a call.

## 2016-06-14 NOTE — Telephone Encounter (Signed)
Patient was seen in the Emergency room on 06/05/2016 from acute abdominal pain and he was diagnosed with diverticulitis. He has an appointment with Dr. Adonis Huguenin on 02/12 but he is having sharp pain where the tear was across from his belt line, when he tries to eat, he develops major gas. He has a fever of 100.3 at the moment. Patient denies Nausea/vomiting. His bowel movements are solid and fine. Please call patient and advice

## 2016-06-19 ENCOUNTER — Encounter: Payer: Self-pay | Admitting: General Surgery

## 2016-06-19 ENCOUNTER — Ambulatory Visit (INDEPENDENT_AMBULATORY_CARE_PROVIDER_SITE_OTHER): Payer: BLUE CROSS/BLUE SHIELD | Admitting: General Surgery

## 2016-06-19 VITALS — BP 135/87 | HR 67 | Temp 98.3°F | Ht 72.0 in | Wt 207.0 lb

## 2016-06-19 DIAGNOSIS — K5720 Diverticulitis of large intestine with perforation and abscess without bleeding: Secondary | ICD-10-CM

## 2016-06-19 NOTE — Patient Instructions (Signed)
Please give [place] a call if you have another Diverticulitis flare-up. If our clinic is closed, please go to the emergency room.  Please finish all of your antibiotics.  After you have your knee surgery and recuperate from it, please give [place] a call so we could schedule your colonoscopy.

## 2016-06-19 NOTE — Progress Notes (Signed)
Outpatient Surgical Follow Up  06/19/2016  Peter Hicks is an 56 y.o. male.   Chief Complaint  Patient presents with  . Other    Diverticulitis of large intestine with perforation without abcess or bleeding 06/05/16    HPI: 56 year old male returns to clinic for follow-up from recent hospital admission for diverticulitis. Patient reports he has 1 more day of oral antibiotics to take. He states his abdominal pain has completely resolved but he continues to have a strange sensation in the left lower quadrant. He describes it mostly as a fullness. He has been eating well and having normal bowel function. He denies any fevers, chills, nausea, vomiting, chest pain, shortness breath, diarrhea, constipation. He has had some night sweats but these also are improving.  Past Medical History:  Diagnosis Date  . Asthma   . Diverticulitis of small intestine with perforation without abscess     Past Surgical History:  Procedure Laterality Date  . HERNIA REPAIR    . TONSILLECTOMY      No family history on file.  Social History:  reports that he has never smoked. He has never used smokeless tobacco. He reports that he drinks about 2.4 oz of alcohol per week . He reports that he does not use drugs.  Allergies:  Allergies  Allergen Reactions  . Cleocin [Clindamycin Hcl]   . Penicillins Hives    Has patient had a PCN reaction causing immediate rash, facial/tongue/throat swelling, SOB or lightheadedness with hypotension: No Has patient had a PCN reaction causing severe rash involving mucus membranes or skin necrosis: {No Has patient had a PCN reaction that required hospitalization No Has patient had a PCN reaction occurring within the last 10 years: No If all of the above answers are "NO", then may proceed with Cephalosporin use.   Marland Kitchen Shellfish-Derived Products Swelling    Facial swelling  . Sulfa Antibiotics     Medications reviewed.    ROS A multipoint review of systems was  completed. All pertinent positives and negatives are documented within the history of present illness and the remainder are negative.   BP 135/87   Pulse 67   Temp 98.3 F (36.8 C) (Oral)   Ht 6' (1.829 m)   Wt 93.9 kg (207 lb)   BMI 28.07 kg/m   Physical Exam Gen.: No acute distress Neck: Supple and nontender Chest: Clear to auscultation Heart: Regular rhythm Abdomen: Soft, nontender, nondistended. Extremities: No evidence of pedal edema Skin: No pathologic ulcers or lesions Neuro: Cranial nerves are grossly intact and moves all extremities well Psych: Alert, oriented, appropriate    No results found for this or any previous visit (from the past 48 hour(s)). No results found.  Assessment/Plan:  1. Diverticulitis of large intestine with perforation without abscess or bleeding 56 year old male with recent admission for diverticulitis with a microperforation. Doing very well clinically. He is also being evaluated for a total knee replacement and has been told that he will require clearance from [place] before he can proceed with that surgery. Discussed the reasoning behind this with the patient and his spouse due to the need for no active infection being ongoing before a prosthetic is implanted. They voiced understanding. Discussed that if he does well for 2 weeks after stopping his antibiotics without any recurrence that would indicate no residual infection within his abdomen and that he would be cleared from a general surgery standpoint to proceed with his knee replacement. Also discussed that he will eventually need a colonoscopy  but that this could be obtained after his knee replacement. Should he have any recurrence of symptoms he is to contact clinic immediately for further evaluation. Otherwise, he will follow up in clinic after his colonoscopy to discuss whether or not he needs an elective sigmoid colon resection.  A total of 25 minutes was used for this encounter with greater than  50% of this used for counseling her coordination of care.   Clayburn Pert, MD FACS General Surgeon  06/19/2016,1:09 PM

## 2016-06-27 DIAGNOSIS — M1712 Unilateral primary osteoarthritis, left knee: Secondary | ICD-10-CM | POA: Insufficient documentation

## 2016-06-27 DIAGNOSIS — M1711 Unilateral primary osteoarthritis, right knee: Secondary | ICD-10-CM | POA: Insufficient documentation

## 2016-07-13 HISTORY — PX: REPLACEMENT TOTAL KNEE: SUR1224

## 2016-08-01 DIAGNOSIS — Z96651 Presence of right artificial knee joint: Secondary | ICD-10-CM | POA: Insufficient documentation

## 2016-09-20 ENCOUNTER — Telehealth: Payer: Self-pay | Admitting: General Practice

## 2016-09-20 NOTE — Telephone Encounter (Signed)
Called patient back to let him know that I will send his referral to [facility] so they could schedule him an appointment and Colonoscopy. Patient understood and had no further questions.  I also told patient that I would follow up but if he didn't hear from [facility] in a week, to please call me back.

## 2016-09-20 NOTE — Telephone Encounter (Signed)
Patient is calling, he was here for a new patient/hospital follow up appointment with Dr. Adonis Huguenin on 06/19/16 patient was told to call our office after he has had knee surgery and recuperate from it, to schedule a colonoscopy. He is calling now to have his a colonoscopy schedule. Will you call this patient and schedule this appointment for him.

## 2016-09-21 ENCOUNTER — Telehealth: Payer: Self-pay | Admitting: Gastroenterology

## 2016-09-21 ENCOUNTER — Telehealth: Payer: Self-pay

## 2016-09-21 NOTE — Telephone Encounter (Signed)
I have put in an internal referral to [facility]   I will follow up within 3-5 days to make sure the appointments have been scheduled.

## 2016-09-21 NOTE — Telephone Encounter (Signed)
Patient is returning a call to schedule a colonoscopy °

## 2016-09-25 ENCOUNTER — Encounter: Payer: Self-pay | Admitting: Gastroenterology

## 2016-09-25 NOTE — Telephone Encounter (Signed)
[name] have not been able to contact this patient

## 2016-09-29 ENCOUNTER — Telehealth: Payer: Self-pay

## 2016-09-29 ENCOUNTER — Other Ambulatory Visit: Payer: Self-pay

## 2016-09-29 DIAGNOSIS — Z1211 Encounter for screening for malignant neoplasm of colon: Secondary | ICD-10-CM

## 2016-09-29 NOTE — Telephone Encounter (Signed)
Called patient since I saw that his Colonoscopy was scheduled to be done on 10/19/2016 by Dr. Allen Norris. Therefore, I went ahead and scheduled his follow up appointment with Dr. Adonis Huguenin on 10/25/2016 at 9:15 AM.

## 2016-09-29 NOTE — Telephone Encounter (Signed)
LVM for pt to return my call to schedule colonoscopy.  

## 2016-09-29 NOTE — Telephone Encounter (Signed)
Gastroenterology Pre-Procedure Review  Request Date: 10/19/16 Requesting Physician: Dr. Allen Norris  PATIENT REVIEW QUESTIONS: The patient responded to the following health history questions as indicated:    1. Are you having any GI issues? no 2. Do you have a personal history of Polyps? no 3. Do you have a family history of Colon Cancer or Polyps? no 4. Diabetes Mellitus? no 5. Joint replacements in the past 12 months?yes (knee replacement 07/13/16 Dr. [facility] Service) 6. Major health problems in the past 3 months?yes (diverticulitis, knee replacement surgery) 7. Any artificial heart valves, MVP, or defibrillator?no    MEDICATIONS & ALLERGIES:    Patient reports the following regarding taking any anticoagulation/antiplatelet therapy:   Plavix, Coumadin, Eliquis, Xarelto, Lovenox, Pradaxa, Brilinta, or Effient? no Aspirin? no  Patient confirms/reports the following medications:  No current outpatient prescriptions on file.   No current facility-administered medications for this visit.     Patient confirms/reports the following allergies:  Allergies  Allergen Reactions  . Cleocin [Clindamycin Hcl]   . Penicillins Hives    Has patient had a PCN reaction causing immediate rash, facial/tongue/throat swelling, SOB or lightheadedness with hypotension: No Has patient had a PCN reaction causing severe rash involving mucus membranes or skin necrosis: {No Has patient had a PCN reaction that required hospitalization No Has patient had a PCN reaction occurring within the last 10 years: No If all of the above answers are "NO", then may proceed with Cephalosporin use.   Marland Kitchen Shellfish-Derived Products Swelling    Facial swelling  . Sulfa Antibiotics     No orders of the defined types were placed in this encounter.   AUTHORIZATION INFORMATION Primary Insurance: 1D#: Group #:  Secondary Insurance: 1D#: Group #:  SCHEDULE INFORMATION: Date: 10/19/16 Dr. Allen Norris Time: Location: [facility]

## 2016-10-09 ENCOUNTER — Telehealth: Payer: Self-pay | Admitting: Gastroenterology

## 2016-10-09 NOTE — Telephone Encounter (Signed)
10/09/16 Per Hildred Alamin at [place] prior Auth required for Screening Colonoscopy [phone] / [id number].

## 2016-10-12 ENCOUNTER — Encounter: Payer: Self-pay | Admitting: *Deleted

## 2016-10-18 ENCOUNTER — Other Ambulatory Visit: Payer: Self-pay

## 2016-10-18 ENCOUNTER — Telehealth: Payer: Self-pay | Admitting: Gastroenterology

## 2016-10-18 DIAGNOSIS — Z1211 Encounter for screening for malignant neoplasm of colon: Secondary | ICD-10-CM

## 2016-10-18 MED ORDER — NA SULFATE-K SULFATE-MG SULF 17.5-3.13-1.6 GM/177ML PO SOLN: 1.0000 | Freq: Once | ORAL | 0 refills | Status: AC

## 2016-10-18 NOTE — Telephone Encounter (Signed)
Patient left a voice message that his procedure is tomorrow and his prep has not been called into [facility] yet. Please call him

## 2016-10-18 NOTE — Telephone Encounter (Signed)
Rx has been called into pt pharmacy.

## 2016-10-18 NOTE — Discharge Instructions (Signed)
General Anesthesia, Adult, Care After °These instructions provide you with information about caring for yourself after your procedure. Your health care provider may also give you more specific instructions. Your treatment has been planned according to current medical practices, but problems sometimes occur. Call your health care provider if you have any problems or questions after your procedure. °What can I expect after the procedure? °After the procedure, it is common to have: °· Vomiting. °· A sore throat. °· Mental slowness. ° °It is common to feel: °· Nauseous. °· Cold or shivery. °· Sleepy. °· Tired. °· Sore or achy, even in parts of your body where you did not have surgery. ° °Follow these instructions at home: °For at least 24 hours after the procedure: °· Do not: °? Participate in activities where you could fall or become injured. °? Drive. °? Use heavy machinery. °? Drink alcohol. °? Take sleeping pills or medicines that cause drowsiness. °? Make important decisions or sign legal documents. °? Take care of children on your own. °· Rest. °Eating and drinking °· If you vomit, drink water, juice, or soup when you can drink without vomiting. °· Drink enough fluid to keep your urine clear or pale yellow. °· Make sure you have little or no nausea before eating solid foods. °· Follow the diet recommended by your health care provider. °General instructions °· Have a responsible adult stay with you until you are awake and alert. °· Return to your normal activities as told by your health care provider. Ask your health care provider what activities are safe for you. °· Take over-the-counter and prescription medicines only as told by your health care provider. °· If you smoke, do not smoke without supervision. °· Keep all follow-up visits as told by your health care provider. This is important. °Contact a health care provider if: °· You continue to have nausea or vomiting at home, and medicines are not helpful. °· You  cannot drink fluids or start eating again. °· You cannot urinate after 8-12 hours. °· You develop a skin rash. °· You have fever. °· You have increasing redness at the site of your procedure. °Get help right away if: °· You have difficulty breathing. °· You have chest pain. °· You have unexpected bleeding. °· You feel that you are having a life-threatening or urgent problem. °This information is not intended to replace advice given to you by your health care provider. Make sure you discuss any questions you have with your health care provider. °Document Released: 07/31/2000 Document Revised: 09/27/2015 Document Reviewed: 04/08/2015 °Elsevier Interactive Patient Education © 2018 Elsevier Inc. ° °

## 2016-10-19 ENCOUNTER — Ambulatory Visit
Admission: RE | Admit: 2016-10-19 | Discharge: 2016-10-19 | Disposition: A | Discharge status: Home / Self Care | Payer: BLUE CROSS/BLUE SHIELD | Source: Ambulatory Visit | Attending: Gastroenterology | Admitting: Gastroenterology

## 2016-10-19 ENCOUNTER — Ambulatory Visit: Payer: BLUE CROSS/BLUE SHIELD | Admitting: Anesthesiology

## 2016-10-19 ENCOUNTER — Other Ambulatory Visit: Payer: Self-pay

## 2016-10-19 ENCOUNTER — Encounter
Admission: RE | Disposition: A | Discharge status: Home / Self Care | Payer: Self-pay | Source: Ambulatory Visit | Attending: Gastroenterology

## 2016-10-19 DIAGNOSIS — J45909 Unspecified asthma, uncomplicated: Secondary | ICD-10-CM | POA: Insufficient documentation

## 2016-10-19 DIAGNOSIS — Z79899 Other long term (current) drug therapy: Secondary | ICD-10-CM | POA: Insufficient documentation

## 2016-10-19 DIAGNOSIS — K5730 Diverticulosis of large intestine without perforation or abscess without bleeding: Secondary | ICD-10-CM | POA: Insufficient documentation

## 2016-10-19 DIAGNOSIS — D122 Benign neoplasm of ascending colon: Secondary | ICD-10-CM

## 2016-10-19 DIAGNOSIS — K640 First degree hemorrhoids: Secondary | ICD-10-CM | POA: Insufficient documentation

## 2016-10-19 DIAGNOSIS — K621 Rectal polyp: Secondary | ICD-10-CM | POA: Diagnosis not present

## 2016-10-19 DIAGNOSIS — Z1211 Encounter for screening for malignant neoplasm of colon: Secondary | ICD-10-CM | POA: Diagnosis present

## 2016-10-19 HISTORY — DX: Unspecified osteoarthritis, unspecified site: M19.90

## 2016-10-19 HISTORY — PX: POLYPECTOMY: SHX5525

## 2016-10-19 HISTORY — PX: COLONOSCOPY WITH PROPOFOL: SHX5780

## 2016-10-19 SURGERY — COLONOSCOPY WITH PROPOFOL
Anesthesia: General | Wound class: Contaminated

## 2016-10-19 MED ORDER — LIDOCAINE HCL (CARDIAC) 20 MG/ML IV SOLN
INTRAVENOUS | Status: DC | PRN
  Administered 2016-10-19: 40 mg via INTRAVENOUS

## 2016-10-19 MED ORDER — PROPOFOL 10 MG/ML IV BOLUS
INTRAVENOUS | Status: DC | PRN
  Administered 2016-10-19: 80 mg via INTRAVENOUS
  Administered 2016-10-19 (×6): 20 mg via INTRAVENOUS

## 2016-10-19 MED ORDER — LACTATED RINGERS IV SOLN
INTRAVENOUS | Status: DC
  Administered 2016-10-19: 10:00:00 via INTRAVENOUS

## 2016-10-19 MED ORDER — SIMETHICONE 40 MG/0.6ML PO SUSP
ORAL | Status: DC | PRN
  Administered 2016-10-19: 11:00:00

## 2016-10-19 SURGICAL SUPPLY — 23 items

## 2016-10-19 NOTE — Transfer of Care (Signed)
Immediate Anesthesia Transfer of Care Note  Patient: Peter Hicks  Procedure(s) Performed: Procedure(s): COLONOSCOPY WITH PROPOFOL (N/A) POLYPECTOMY  Patient Location: PACU  Anesthesia Type: General  Level of Consciousness: awake, alert  and patient cooperative  Airway and Oxygen Therapy: Patient Spontanous Breathing and Patient connected to supplemental oxygen  Post-op Assessment: Post-op Vital signs reviewed, Patient's Cardiovascular Status Stable, Respiratory Function Stable, Patent Airway and No signs of Nausea or vomiting  Post-op Vital Signs: Reviewed and stable  Complications: No apparent anesthesia complications

## 2016-10-19 NOTE — Anesthesia Procedure Notes (Signed)
Performed by: Jhony Antrim Pre-anesthesia Checklist: Patient identified, Emergency Drugs available, Suction available, Timeout performed and Patient being monitored Patient Re-evaluated:Patient Re-evaluated prior to induction Oxygen Delivery Method: Nasal cannula Placement Confirmation: positive ETCO2       

## 2016-10-19 NOTE — Anesthesia Postprocedure Evaluation (Addendum)
Anesthesia Post Note  Patient: Peter Hicks  Procedure(s) Performed: Procedure(s) (LRB): COLONOSCOPY WITH PROPOFOL (N/A) POLYPECTOMY  Patient location during evaluation: PACU Anesthesia Type: General Level of consciousness: awake and alert Pain management: pain level controlled Vital Signs Assessment: post-procedure vital signs reviewed and stable Respiratory status: spontaneous breathing, nonlabored ventilation, respiratory function stable and patient connected to nasal cannula oxygen Cardiovascular status: blood pressure returned to baseline and stable Postop Assessment: no signs of nausea or vomiting Anesthetic complications: no    Olesya Wike

## 2016-10-19 NOTE — Op Note (Signed)
[facility] Patient Name: Peter Hicks Procedure Date: 10/19/2016 11:06 AM MRN: [id number] Account #: [id number] Date of Birth: 17-Jun-1960 Admit Type: Outpatient Age: 56 Room: [facility] OR ROOM 01 Gender: Male Note Status: Finalized Procedure:            Colonoscopy Indications:          Screening for colorectal malignant neoplasm Providers:            [name] MD, MD Referring MD:         Clayburn Pert, MD (Referring MD) Medicines:            Propofol per Anesthesia Complications:        No immediate complications. Procedure:            Pre-Anesthesia Assessment:                       - Prior to the procedure, a History and Physical was                        performed, and patient medications and allergies were                        reviewed. The patient's tolerance of previous                        anesthesia was also reviewed. The risks and benefits of                        the procedure and the sedation options and risks were                        discussed with the patient. All questions were                        answered, and informed consent was obtained. Prior                        Anticoagulants: The patient has taken no previous                        anticoagulant or antiplatelet agents. ASA Grade                        Assessment: II - A patient with mild systemic disease.                        After reviewing the risks and benefits, the patient was                        deemed in satisfactory condition to undergo the                        procedure.                       After obtaining informed consent, the colonoscope was                        passed under direct vision. Throughout the procedure,  the patient's blood pressure, pulse, and oxygen                        saturations were monitored continuously. The Blanchard [id number]) was introduced through the                         anus and advanced to the the cecum, identified by                        appendiceal orifice and ileocecal valve. The                        colonoscopy was performed without difficulty. The                        patient tolerated the procedure well. The quality of                        the bowel preparation was excellent. Findings:      The perianal and digital rectal examinations were normal.      A 3 mm polyp was found in the ascending colon. The polyp was sessile.       The polyp was removed with a cold biopsy forceps. Resection and       retrieval were complete.      Multiple small-mouthed diverticula were found in the sigmoid colon.      Non-bleeding internal hemorrhoids were found during retroflexion. The       hemorrhoids were Grade I (internal hemorrhoids that do not prolapse).      A 3 mm polyp was found in the rectum. The polyp was sessile. The polyp       was removed with a cold biopsy forceps. Resection and retrieval were       complete. Impression:           - One 3 mm polyp in the ascending colon, removed with a                        cold biopsy forceps. Resected and retrieved.                       - Diverticulosis in the sigmoid colon.                       - Non-bleeding internal hemorrhoids.                       - One 3 mm polyp in the rectum, removed with a cold                        biopsy forceps. Resected and retrieved. Recommendation:       - Discharge patient to home.                       - Resume previous diet.                       - Continue present medications.                       -  Await pathology results. Procedure Code(s):    --- Professional ---                       [phone], Colonoscopy, flexible; with biopsy, single or                        multiple Diagnosis Code(s):    --- Professional ---                       Z12.11, Encounter for screening for malignant neoplasm                        of colon                        D12.2, Benign neoplasm of ascending colon                       K62.1, Rectal polyp CPT copyright 2016 American Medical Association. All rights reserved. The codes documented in this report are preliminary and upon coder review may  be revised to meet current compliance requirements. [name] MD, MD 10/19/2016 11:23:27 AM This report has been signed electronically. Number of Addenda: 0 Note Initiated On: 10/19/2016 11:06 AM Scope Withdrawal Time: 0 hours 7 minutes 37 seconds  Total Procedure Duration: 0 hours 8 minutes 56 seconds       [facility]

## 2016-10-19 NOTE — Anesthesia Preprocedure Evaluation (Signed)
Anesthesia Evaluation  Patient identified by MRN, date of birth, ID band  Reviewed: NPO status   History of Anesthesia Complications Negative for: history of anesthetic complications  Airway Mallampati: II  TM Distance: >3 FB Neck ROM: full    Dental no notable dental hx.    Pulmonary neg pulmonary ROS,    Pulmonary exam normal        Cardiovascular Exercise Tolerance: Good negative cardio ROS Normal cardiovascular exam     Neuro/Psych R eye medial redness negative neurological ROS  negative psych ROS   GI/Hepatic Neg liver ROS, Diverticulitis of small intestine    Endo/Other  negative endocrine ROS  Renal/GU negative Renal ROS  negative genitourinary   Musculoskeletal  (+) Arthritis ,   Abdominal   Peds  Hematology negative hematology ROS (+)   Anesthesia Other Findings tiva  Reproductive/Obstetrics                             Anesthesia Physical Anesthesia Plan  ASA: II  Anesthesia Plan: General   Post-op Pain Management:    Induction:   PONV Risk Score and Plan:   Airway Management Planned:   Additional Equipment:   Intra-op Plan:   Post-operative Plan:   Informed Consent: I have reviewed the patients History and Physical, chart, labs and discussed the procedure including the risks, benefits and alternatives for the proposed anesthesia with the patient or authorized representative who has indicated his/her understanding and acceptance.     Plan Discussed with: CRNA  Anesthesia Plan Comments:         Anesthesia Quick Evaluation

## 2016-10-19 NOTE — H&P (Signed)
   Peter Lame, MD [facility] [address], [place], [place] [address] Phone: [phone] Fax : [phone]  Primary Care Physician:  Lorelee Market, MD Primary Gastroenterologist:  Dr. Allen Norris  Pre-Procedure History & Physical: HPI:  Peter Hicks is a 56 y.o. male is here for a screening colonoscopy.   Past Medical History:  Diagnosis Date  . Arthritis    knees  . Asthma    as child  . Diverticulitis of small intestine with perforation without abscess     Past Surgical History:  Procedure Laterality Date  . HERNIA REPAIR    . 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 TONSILLECTOMY      Prior to Admission medications   Medication Sig Start Date End Date Taking? Authorizing Provider  MELATONIN PO Take by mouth.   Yes [provider]  Multiple Vitamins-Minerals (MULTIVITAMIN ADULT PO) Take by mouth. Dr Auburn Bilberry vitamins   Yes [provider]  TRYPTOPHAN PO Take by mouth.   Yes [provider]    Allergies as of 09/29/2016 - Review Complete 06/19/2016  Allergen Reaction Noted  . Cleocin [clindamycin hcl]  06/05/2016  . Penicillins Hives 06/05/2016  . Shellfish-derived products Swelling 01/21/2014  . Sulfa antibiotics  06/05/2016    History reviewed. No pertinent family history.  Social History   Social History  . Marital status: Married    Spouse name: N/A  . Number of children: N/A  . Years of education: N/A   Occupational History  . Not on file.   Social History Main Topics  . Smoking status: Never Smoker  . Smokeless tobacco: Never Used  . Alcohol use 3.6 oz/week    6 Cans of beer per week  . Drug use: No  . Sexual activity: Not on file   Other Topics Concern  . Not on file   Social History Narrative  . No narrative on file    Review of Systems: See HPI, otherwise negative ROS  Physical Exam: BP 137/82   Pulse (!) 59   Temp 98.6 F (37 C) (Temporal)   Ht 6' (1.829 m)   Wt 197  lb (89.4 kg)   SpO2 100%   BMI 26.72 kg/m  General:   Alert,  pleasant and cooperative in NAD Head:  Normocephalic and atraumatic. Neck:  Supple; no masses or thyromegaly. Lungs:  Clear throughout to auscultation.    Heart:  Regular rate and rhythm. Abdomen:  Soft, nontender and nondistended. Normal bowel sounds, without guarding, and without rebound.   Neurologic:  Alert and  oriented x4;  grossly normal neurologically.  Impression/Plan: [name] is now here to undergo a screening colonoscopy.  Risks, benefits, and alternatives regarding colonoscopy have been reviewed with the patient.  Questions have been answered.  All parties agreeable.

## 2016-10-23 ENCOUNTER — Encounter: Payer: Self-pay | Admitting: Gastroenterology

## 2016-10-25 ENCOUNTER — Encounter: Payer: Self-pay | Admitting: General Surgery

## 2016-10-25 ENCOUNTER — Ambulatory Visit (INDEPENDENT_AMBULATORY_CARE_PROVIDER_SITE_OTHER): Payer: BLUE CROSS/BLUE SHIELD | Admitting: General Surgery

## 2016-10-25 VITALS — BP 138/84 | HR 66 | Temp 98.6°F | Ht 72.0 in | Wt 204.8 lb

## 2016-10-25 DIAGNOSIS — K5720 Diverticulitis of large intestine with perforation and abscess without bleeding: Secondary | ICD-10-CM

## 2016-10-25 NOTE — Patient Instructions (Signed)
Please see the Diverticulitis and High Fiber Diet enclosed.  Please call our office if you develop similar symptoms at any time. We will start you on antibiotics immediately and will need to see you in clinic within 24 hours.  If you are using the high fiber diet and increased water intake, and having difficulty with constipation or straining while having a bowel movement- you may use Colace 100mg  Tablets twice daily to soften stools and Miralax 17grams 1-2 times daily for a mild laxative.   Diverticulitis Diverticulitis is inflammation or infection of small pouches in your colon that form when you have a condition called diverticulosis. The pouches in your colon are called diverticula. Your colon, or large intestine, is where water is absorbed and stool is formed. Complications of diverticulitis can include:  Bleeding.  Severe infection.  Severe pain.  Perforation of your colon.  Obstruction of your colon.  What are the causes? Diverticulitis is caused by bacteria. Diverticulitis happens when stool becomes trapped in diverticula. This allows bacteria to grow in the diverticula, which can lead to inflammation and infection. What increases the risk? People with diverticulosis are at risk for diverticulitis. Eating a diet that does not include enough fiber from fruits and vegetables may make diverticulitis more likely to develop. What are the signs or symptoms? Symptoms of diverticulitis may include:  Abdominal pain and tenderness. The pain is normally located on the left side of the abdomen, but may occur in other areas.  Fever and chills.  Bloating.  Cramping.  Nausea.  Vomiting.  Constipation.  Diarrhea.  Blood in your stool.  How is this diagnosed? Your health care provider will ask you about your medical history and do a physical exam. You may need to have tests done because many medical conditions can cause the same symptoms as diverticulitis. Tests may  include:  Blood tests.  Urine tests.  Imaging tests of the abdomen, including X-rays and CT scans.  When your condition is under control, your health care provider may recommend that you have a colonoscopy. A colonoscopy can show how severe your diverticula are and whether something else is causing your symptoms. How is this treated? Most cases of diverticulitis are mild and can be treated at home. Treatment may include:  Taking over-the-counter pain medicines.  Following a clear liquid diet.  Taking antibiotic medicines by mouth for 7-10 days.  More severe cases may be treated at a hospital. Treatment may include:  Not eating or drinking.  Taking prescription pain medicine.  Receiving antibiotic medicines through an IV tube.  Receiving fluids and nutrition through an IV tube.  Surgery.  Follow these instructions at home:  Follow your health care provider's instructions carefully.  Follow a full liquid diet or other diet as directed by your health care provider. After your symptoms improve, your health care provider may tell you to change your diet. He or she may recommend you eat a high-fiber diet. Fruits and vegetables are good sources of fiber. Fiber makes it easier to pass stool.  Take fiber supplements or probiotics as directed by your health care provider.  Only take medicines as directed by your health care provider.  Keep all your follow-up appointments. Contact a health care provider if:  Your pain does not improve.  You have a hard time eating food.  Your bowel movements do not return to normal. Get help right away if:  Your pain becomes worse.  Your symptoms do not get better.  Your symptoms suddenly  get worse.  You have a fever.  You have repeated vomiting.  You have bloody or black, tarry stools. This information is not intended to replace advice given to you by your health care provider. Make sure you discuss any questions you have with your  health care provider. Document Released: 02/01/2005 Document Revised: 09/30/2015 Document Reviewed: 03/19/2013 Elsevier Interactive Patient Education  2017 [employer].     High-Fiber Diet Fiber, also called dietary fiber, is a type of carbohydrate found in fruits, vegetables, whole grains, and beans. A high-fiber diet can have many health benefits. Your health care provider may recommend a high-fiber diet to help:  Prevent constipation. Fiber can make your bowel movements more regular.  Lower your cholesterol.  Relieve hemorrhoids, uncomplicated diverticulosis, or irritable bowel syndrome.  Prevent overeating as part of a weight-loss plan.  Prevent heart disease, type 2 diabetes, and certain cancers.  What is my plan? The recommended daily intake of fiber includes:  38 grams for men under age 36.  74 grams for men over age 82.  71 grams for women under age 5.  80 grams for women over age 88.  You can get the recommended daily intake of dietary fiber by eating a variety of fruits, vegetables, grains, and beans. Your health care provider may also recommend a fiber supplement if it is not possible to get enough fiber through your diet. What do I need to know about a high-fiber diet?  Fiber supplements have not been widely studied for their effectiveness, so it is better to get fiber through food sources.  Always check the fiber content on thenutrition facts label of any prepackaged food. Look for foods that contain at least 5 grams of fiber per serving.  Ask your dietitian if you have questions about specific foods that are related to your condition, especially if those foods are not listed in the following section.  Increase your daily fiber consumption gradually. Increasing your intake of dietary fiber too quickly may cause bloating, cramping, or gas.  Drink plenty of water. Water helps you to digest fiber. What foods can I eat? Grains Whole-grain breads. Multigrain  cereal. Oats and oatmeal. Brown rice. Barley. Bulgur wheat. [place]. Bran muffins. Popcorn. Rye wafer crackers. Vegetables Sweet potatoes. Spinach. Kale. Artichokes. Cabbage. Broccoli. Green peas. Carrots. Squash. Fruits Berries. Pears. Apples. Oranges. Avocados. Prunes and raisins. Dried figs. Meats and Other Protein Sources [place], kidney, pinto, and soy beans. Split peas. Lentils. Nuts and seeds. Dairy Fiber-fortified yogurt. Beverages Fiber-fortified soy milk. Fiber-fortified orange juice. Other Fiber bars. The items listed above may not be a complete list of recommended foods or beverages. Contact your dietitian for more options. What foods are not recommended? Grains White bread. Pasta made with refined flour. White rice. Vegetables Fried potatoes. Canned vegetables. Well-cooked vegetables. Fruits Fruit juice. Cooked, strained fruit. Meats and Other Protein Sources Fatty cuts of meat. Fried Sales executive or fried fish. Dairy Milk. Yogurt. Cream cheese. Sour cream. Beverages Soft drinks. Other Cakes and pastries. Butter and oils. The items listed above may not be a complete list of foods and beverages to avoid. Contact your dietitian for more information. What are some tips for including high-fiber foods in my diet?  Eat a wide variety of high-fiber foods.  Make sure that half of all grains consumed each day are whole grains.  Replace breads and cereals made from refined flour or white flour with whole-grain breads and cereals.  Replace white rice with brown rice, bulgur wheat, or millet.  Start the day with a breakfast that is high in fiber, such as a cereal that contains at least 5 grams of fiber per serving.  Use beans in place of meat in soups, salads, or pasta.  Eat high-fiber snacks, such as berries, raw vegetables, nuts, or popcorn. This information is not intended to replace advice given to you by your health care provider. Make sure you discuss any questions you have  with your health care provider. Document Released: 04/24/2005 Document Revised: 09/30/2015 Document Reviewed: 10/07/2013 Elsevier Interactive Patient Education  2017 [employer].

## 2016-10-25 NOTE — Progress Notes (Signed)
Outpatient Surgical Follow Up  10/25/2016  Peter Hicks is an 56 y.o. male.   Chief Complaint  Patient presents with  . Follow-up    Diverticulitis    HPI: 56 year old male returns to clinic for follow-up from diverticulitis. He has recently underwent a colonoscopy and he is here to discuss the results and further treatment options. Patient reports that he has had no further symptoms of diverticulitis since completing his antibiotic therapy approximately 5 months ago. He is eating well and having normal bowel function. He denies any fevers, chills, nausea, vomiting, chest pain, shortness of breath, diarrhea, constipation. He has undergone knee replacement since he was last seen in this clinic and states he is doing well from that  Past Medical History:  Diagnosis Date  . Arthritis    knees  . Asthma    as child  . Diverticulitis of small intestine with perforation without abscess     Past Surgical History:  Procedure Laterality Date  . COLONOSCOPY WITH PROPOFOL N/A 10/19/2016   Procedure: COLONOSCOPY WITH PROPOFOL;  Surgeon: [name], MD;  Location: [place];  Service: Endoscopy;  Laterality: N/A;  . HERNIA REPAIR    . POLYPECTOMY  10/19/2016   Procedure: POLYPECTOMY;  Surgeon: [name], MD;  Location: [place];  Service: Endoscopy;;  . REPLACEMENT TOTAL KNEE Right 07/13/2016   [facility]  . TONSILLECTOMY      Family History  Problem Relation Age of Onset  . Healthy Mother   . Healthy Father   . Pancreatic cancer Maternal Grandmother   . Diabetes Paternal Grandfather     Social History:  reports that he has never smoked. He has never used smokeless tobacco. He reports that he drinks alcohol. He reports that he does not use drugs.  Allergies:  Allergies  Allergen Reactions  . Aspirin Other (See Comments)    Due to diverticulitis  . Cleocin [Clindamycin Hcl] Other (See Comments)    Doesn't remember reaction  . Nsaids Other  (See Comments)    Due to diverticulitis  . Penicillins Hives    Has patient had a PCN reaction causing immediate rash, facial/tongue/throat swelling, SOB or lightheadedness with hypotension: No Has patient had a PCN reaction causing severe rash involving mucus membranes or skin necrosis: {No Has patient had a PCN reaction that required hospitalization No Has patient had a PCN reaction occurring within the last 10 years: No If all of the above answers are "NO", then may proceed with Cephalosporin use.   Marland Kitchen Shellfish-Derived Products Swelling    Facial swelling Facial swelling  . Sulfa Antibiotics Rash    yeast infection    Medications reviewed.    ROS A multipoint review of systems was completed. All pertinent positives and negatives are documented within the history of present illness and remainder are negative   BP 138/84   Pulse 66   Temp 98.6 F (37 C) (Oral)   Ht 6' (1.829 m)   Wt 92.9 kg (204 lb 12.8 oz)   BMI 27.78 kg/m   Physical Exam Gen.: No acute distress Neck: Supple and nontender Chest: Clear to auscultation Heart: Regular rhythm Abdomen: Soft, nontender, nondistended Extremities: Moves Ultram as well, well-healed right knee replacement.    No results found for this or any previous visit (from the past 48 hour(s)). No results found.  Assessment/Plan:  1. Diverticulitis of large intestine with perforation without abscess or bleeding 56 year old male with a history of diverticulitis with microperforation earlier this  year. Reviewed his colonoscopy results in detail to include benign polyps removed and the small mouth diverticula that were visualized. Discussed at length the elective surgical options of a laparoscopic sigmoid colectomy. The procedure was described in detail to include the risks, benefits, alternatives. However, given the patient is only had one truly confirm bout of diverticulitis had a frank conversation about the risk of recurrence and  whether or not we should proceed with a major operation. After this conversation the patient elects to take a wait and see approach. He understands to call the clinic immediately should he develop any symptoms of that he could be started on antibiotic. At this point he will wait have at least 1 more bout of diverticulitis before he decides to proceed with a major abdominal operation. He will follow up in clinic on an as-needed basis.  A total of 15 minutes was use of this encounter with greater than 50% of it used for counseling and coordination of care.     Clayburn Pert, MD FACS General Surgeon  10/25/2016,10:26 AM

## 2017-05-11 IMAGING — CT CT ABD-PELV W/ CM
2 of 5 series · 15 of 46 positions shown, 17 images · IV contrast (APPLIED)
Comparison: None.

CLINICAL DATA: Acute lower abdominal pain since [REDACTED].

EXAM:
CT ABDOMEN AND PELVIS WITH CONTRAST
TECHNIQUE: Multidetector CT imaging of the abdomen and pelvis was performed
using the standard protocol following bolus administration of
intravenous contrast.
CONTRAST:  100mL C3NFSB-P88 IOPAMIDOL (C3NFSB-P88) INJECTION 61%

[Series 2: routine abd/pel with · axial · 0.80mm/px · z∈[-1016,-556]mm · 12 of 104 slices shown, 14 images]
[im 6/104  soft-tissue]
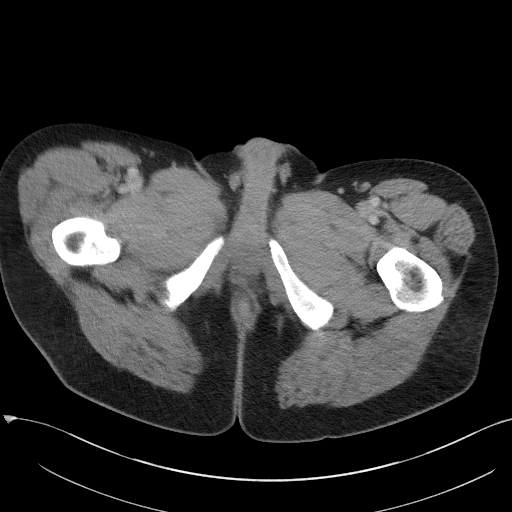
[im 6/104  bone]
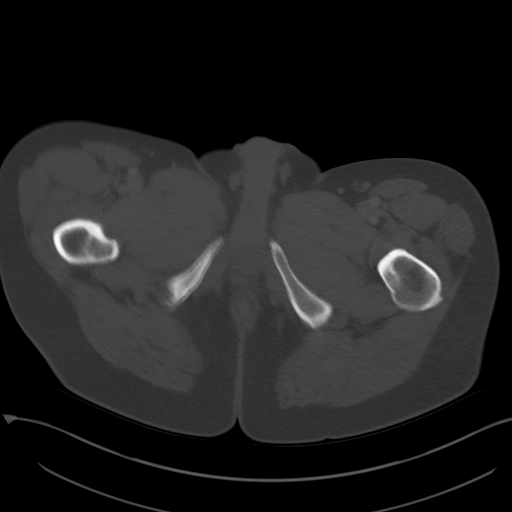
[im 18/104  soft-tissue]
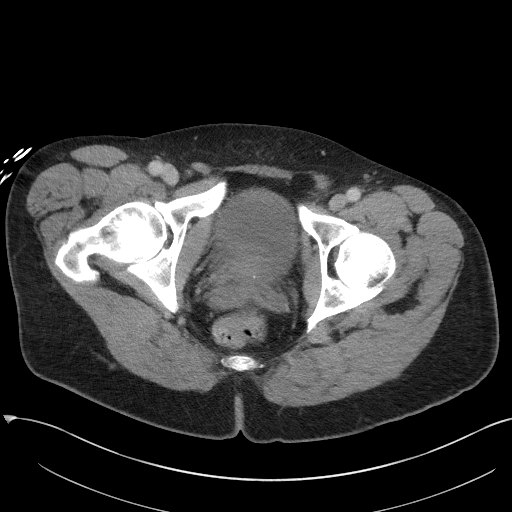
[im 23/104  soft-tissue]
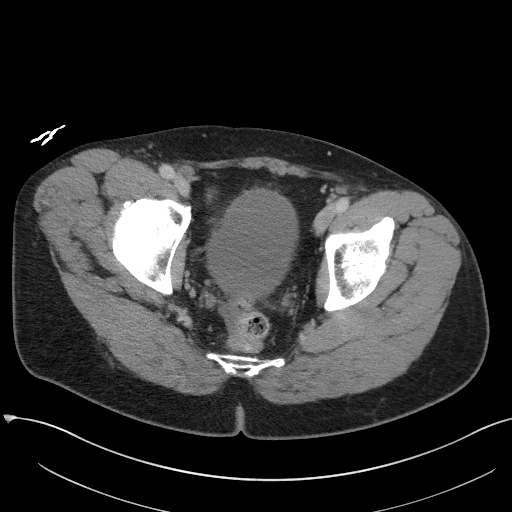
[im 29/104  soft-tissue]
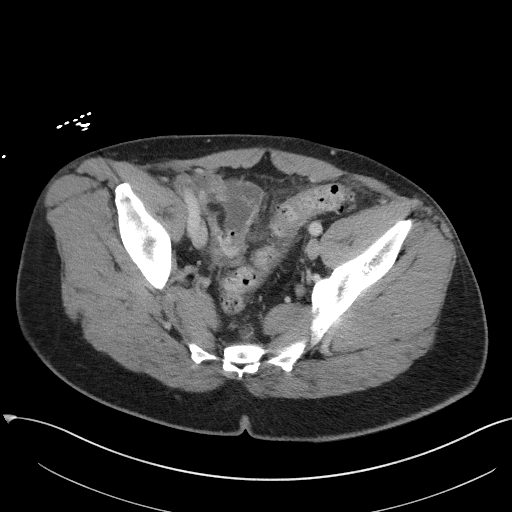
[im 41/104  soft-tissue]
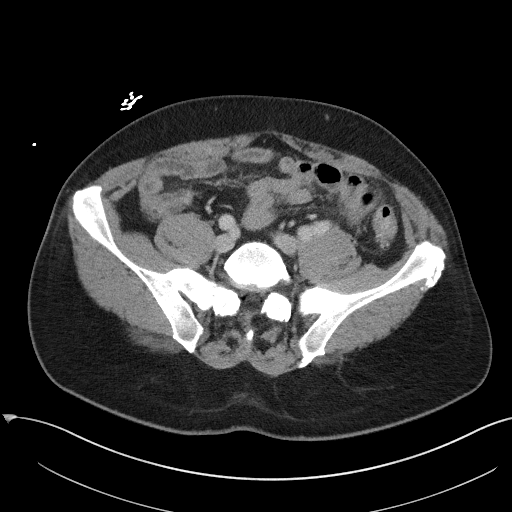
[im 46/104  soft-tissue]
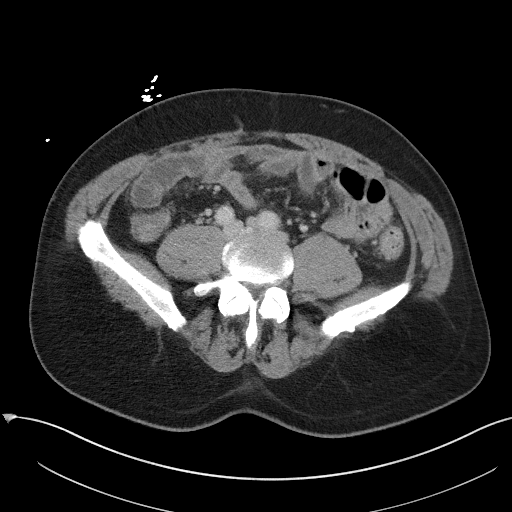
[im 58/104  soft-tissue]
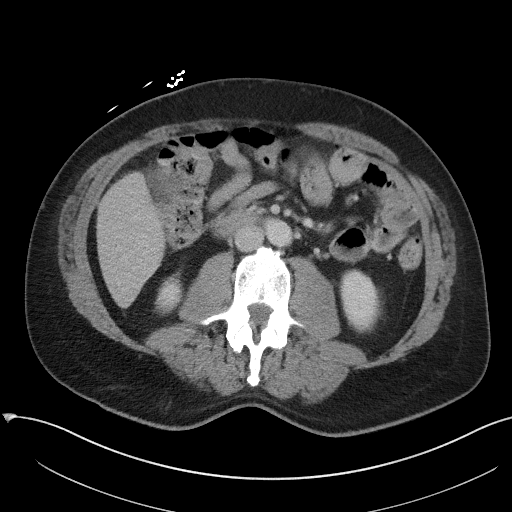
[im 63/104  soft-tissue]
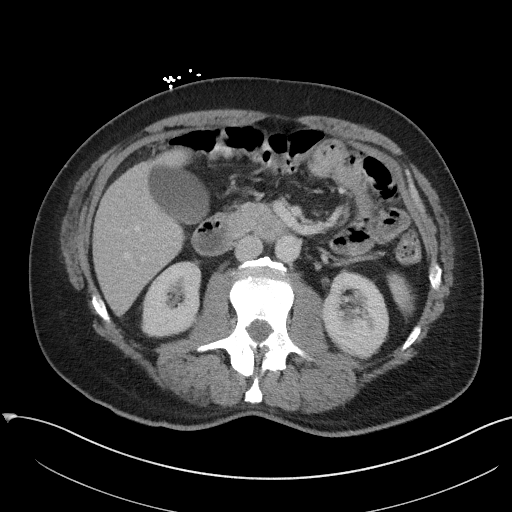
[im 75/104  soft-tissue]
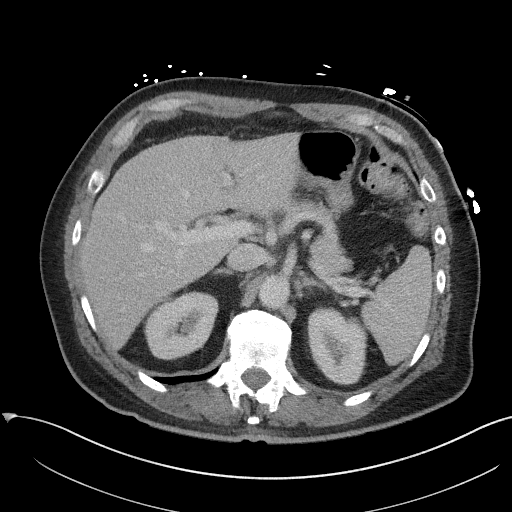
[im 75/104  bone]
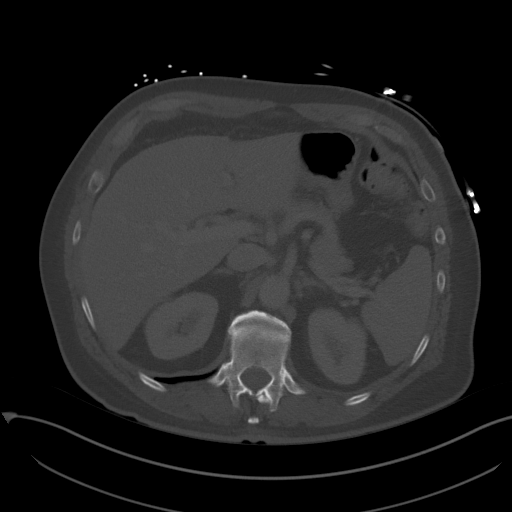
[im 81/104  soft-tissue]
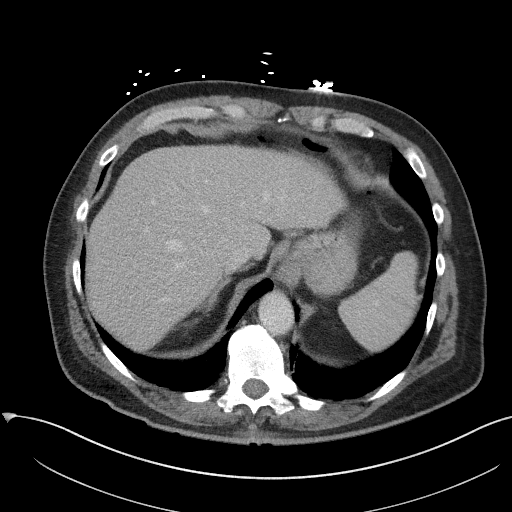
[im 86/104  soft-tissue]
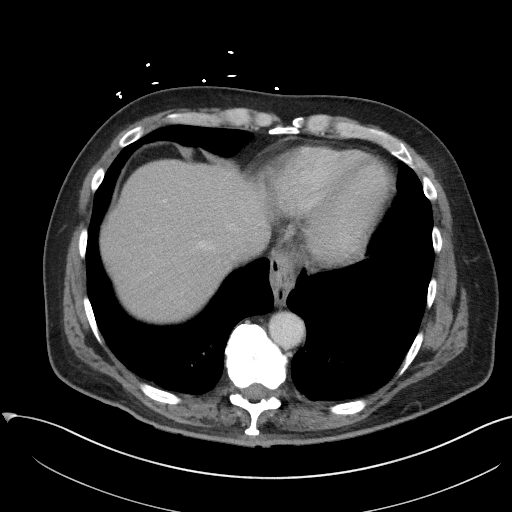
[im 98/104  soft-tissue]
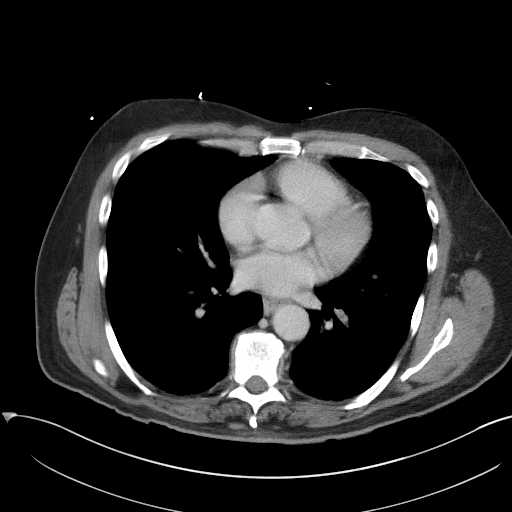

[Series 5: coronal st · coronal · 0.67mm/px · 3 of 96 slices shown]
[im 32/96  soft-tissue]
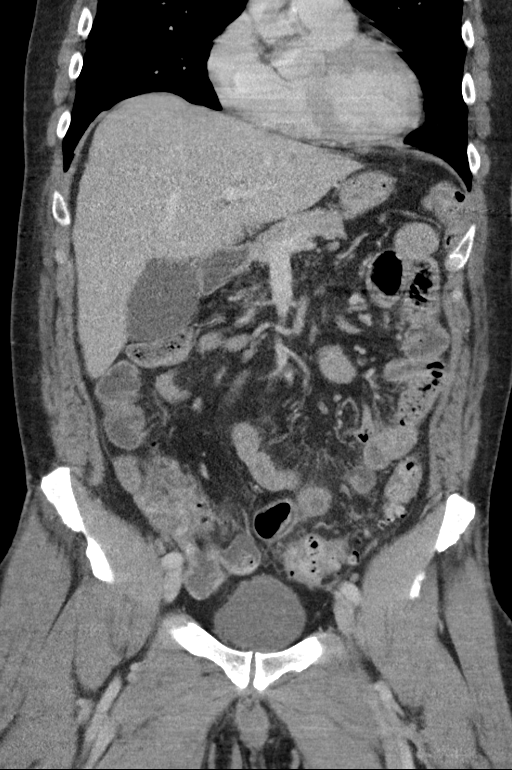
[im 43/96  soft-tissue]
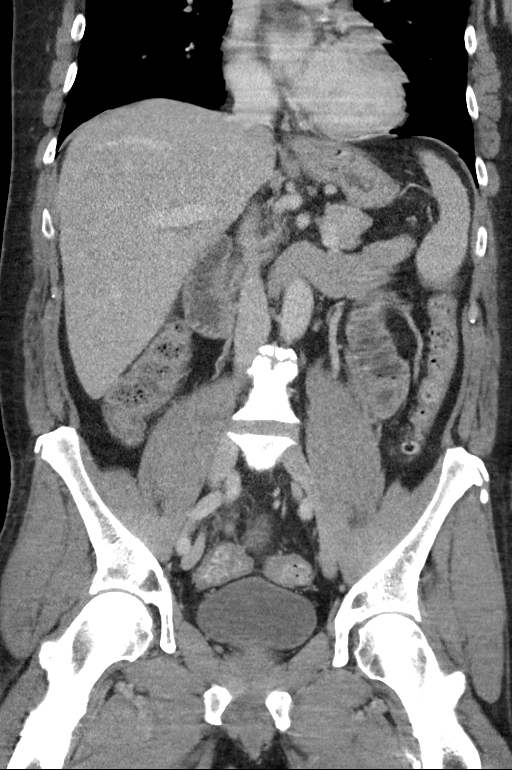
[im 53/96  soft-tissue]
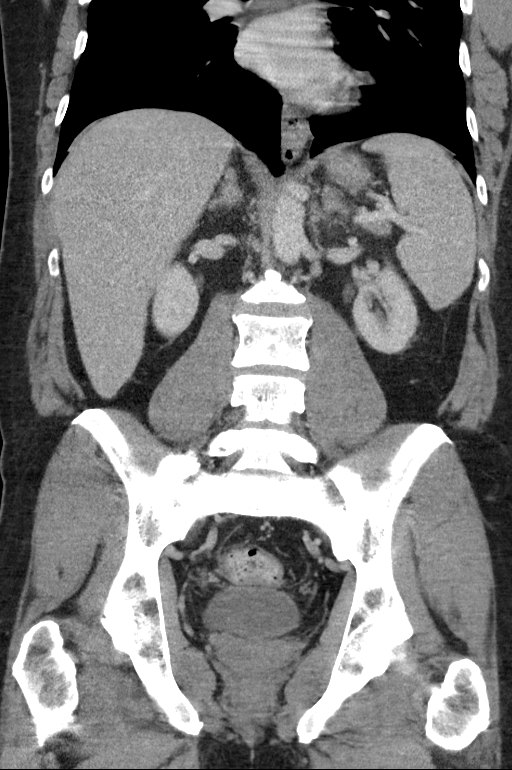

[15 of 46 positions shown; findings below may reference images not displayed]

FINDINGS: Lower chest: Minor dependent basilar atelectasis. Mild cardiomegaly.
No pericardial or pleural effusion. Small hiatal hernia.
Degenerative changes of the lower thoracic spine.

Hepatobiliary: No focal liver abnormality is seen. No gallstones,
gallbladder wall thickening, or biliary dilatation.

Pancreas: Unremarkable. No pancreatic ductal dilatation or
surrounding inflammatory changes.

Spleen: Normal in size without focal abnormality.

Adrenals/Urinary Tract: Adrenal glands are unremarkable. Kidneys are
normal, without renal calculi, focal lesion, or hydronephrosis.
Bladder is unremarkable.

Stomach/Bowel: Negative for significant dilatation, obstruction
pattern or abscess. In the pelvis, there is midline lower
abdominopelvic strandy inflammation/edema about the sigmoid colon
and the adjacent terminal ileum. Diverticulosis is evident of the
sigmoid. There is a tiny focus of extra luminal air suspected, image
72 adjacent to the sigmoid colon and terminal ileum. Appearance is
compatible with sigmoid diverticulitis and suspected micro
perforation. Adjacent terminal ileum demonstrates inflammation /
mild wall thickening, suspect reactive enteritis. Portions of the
appendix are demonstrated along the right iliac vessels appearing
unremarkable. Trace dependent pelvic free fluid inferiorly, image
84.

Vascular/Lymphatic: Minor aortic atherosclerosis and tortuosity. No
acute vascular process. No adenopathy.

Reproductive: Prostate calcifications noted. Seminal vesicles
prostate normal in size. No acute finding.

Other: No inguinal abnormality. Negative for hernia. Intact
abdominal wall.

Musculoskeletal: Degenerative changes of the spine diffusely. No
acute osseous finding.
IMPRESSION: Acute sigmoid diverticulitis with a small adjacent micro perforation
suspected.

Mild wall thickening/ prominence of the terminal ileum, suspect
reactive enteritis from the adjacent inflammation.

Trace pelvic free fluid related to the inflammatory process.
Negative for abscess, or obstruction

## 2017-12-25 ENCOUNTER — Encounter: Payer: Self-pay | Admitting: Surgery

## 2017-12-25 ENCOUNTER — Ambulatory Visit (INDEPENDENT_AMBULATORY_CARE_PROVIDER_SITE_OTHER): Payer: BLUE CROSS/BLUE SHIELD | Admitting: Surgery

## 2017-12-25 VITALS — BP 138/90 | HR 56 | Temp 98.8°F | Wt 203.0 lb

## 2017-12-25 DIAGNOSIS — R1032 Left lower quadrant pain: Secondary | ICD-10-CM

## 2017-12-25 NOTE — Progress Notes (Signed)
Surgical Clinic Progress/Follow-up Note   HPI:  57 y.o. Male known to our practice and last seen just over 1 year ago by Dr. Adonis Huguenin after having been admitted to [facility] (06/05/2016) and treated non-operatively with antibiotics for uncomplicated sigmoid colonic diverticulitis presents to clinic for evaluation of LLQ pain. Patient reports he developed moderately severe LLQ abdominal pain this past weekend (described as 5 out of 10 severity), for which patient and his wife (an Therapist, sports) empirically switched him to a clear liquids diet and called yesterday to schedule surgical evaluation today. However, patient adds that his pain had already improved yesterday and describes it today as 1 out of 10, "barely noticeable" and only when he presses on his abdomen. He otherwise reports +flatus and +BM's without any recent constipation or blood per rectum without N/V, fever/chills, CP, or SOB.  Review of Systems:  Constitutional: denies any other weight loss, fever, chills, or sweats  Eyes: denies any other vision changes, history of eye injury  ENT: denies sore throat, hearing problems  Respiratory: denies shortness of breath, wheezing  Cardiovascular: denies chest pain, palpitations  Gastrointestinal: abdominal pain, N/V, and bowel function as per HPI Musculoskeletal: denies any other joint pains or cramps  Skin: Denies any other rashes or skin discolorations  Neurological: denies any other headache, dizziness, weakness  Psychiatric: denies any other depression, anxiety  All other review of systems: otherwise negative   Vital Signs:  BP 138/90   Pulse (!) 56   Temp 98.8 F (37.1 C) (Oral)   Wt 203 lb (92.1 kg)   BMI 27.53 kg/m    Physical Exam:  Constitutional:  -- Normal body habitus  -- Awake, alert, and oriented x3  Eyes:  -- Pupils equally round and reactive to light  -- No scleral icterus  Ear, nose, throat:  -- No jugular venous distension  -- No nasal drainage, bleeding Pulmonary:  --  No crackles -- Equal breath sounds bilaterally -- Breathing non-labored at rest Cardiovascular:  -- S1, S2 present  -- No pericardial rubs  Gastrointestinal:  -- Soft and non-distended with minimal very focal LLQ abdominal tenderness to only deep palpation without guarding/rebound tenderness to palpation -- No abdominal masses appreciated, pulsatile or otherwise  Musculoskeletal / Integumentary:  -- Wounds or skin discoloration: None appreciated -- Extremities: B/L UE and LE FROM, hands and feet warm, no edema  Neurologic:  -- Motor function: intact and symmetric  -- Sensation: intact and symmetric   Laboratory studies:  CBC Latest Ref Rng & Units 06/07/2016 06/06/2016 06/05/2016  WBC 3.8 - 10.6 K/uL 9.1 10.4 13.7(H)  Hemoglobin 13.0 - 18.0 g/dL 12.4(L) 12.2(L) 14.0  Hematocrit 40.0 - 52.0 % 36.0(L) 34.7(L) 39.5(L)  Platelets 150 - 440 K/uL 195 174 217   Imaging: No new pertinent imaging studies available for review   Assessment:  57 y.o. yo Male with a problem list including...  Patient Active Problem List   Diagnosis Date Noted  . Special screening for malignant neoplasms, colon   . Benign neoplasm of ascending colon   . Rectal polyp   . 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 Primary osteoarthritis of left knee 06/27/2016  . Primary osteoarthritis of right knee 06/27/2016  . Diverticulitis of large intestine with perforation without abscess or bleeding 06/05/2016    presents to clinic for evaluation of LLQ abdominal pain suspicious for second episode of uncomplicated sigmoid colonic diverticulitis, though pain nearly resolved with only clear liquids diet x 2 - 3 days  without antibiotics.  Plan:   - discussed differential diagnosis and consideration for further imaging (CT)  - agree to gradually increase diet from clear liquids to full liquids to soft diet with improving pain  - will hold antibiotics for now, since LLQ abdominal pain nearly resolved, but  instructed patient to call if pain fails to further improve or worsens, in which case will start by prescribing antibiotics  - routine outpatient follow-up in 1 week offered, patient expresses preference to follow up as needed  - instructed to call office if any questions or concerns  All of the above recommendations were discussed with the patient family, and all of patient's questions were answered to his expressed satisfaction.  -- Marilynne Drivers Rosana Hoes, MD, [place]: [facility] - Partnering for exceptional care. Office: [phone]

## 2017-12-25 NOTE — Patient Instructions (Signed)

## 2023-11-26 ENCOUNTER — Encounter: Payer: Self-pay | Admitting: Family Medicine

## 2023-11-26 ENCOUNTER — Ambulatory Visit (INDEPENDENT_AMBULATORY_CARE_PROVIDER_SITE_OTHER): Payer: Self-pay | Admitting: Family Medicine

## 2023-11-26 VITALS — BP 146/87 | HR 49 | Temp 98.4°F | Resp 18 | Ht 72.0 in | Wt 209.0 lb

## 2023-11-26 DIAGNOSIS — R030 Elevated blood-pressure reading, without diagnosis of hypertension: Secondary | ICD-10-CM

## 2023-11-26 NOTE — Progress Notes (Signed)
 New Patient Office Visit  Subjective    Patient ID: Peter Hicks, male    DOB: 05-11-1960  Age: 63 y.o. MRN: [id number]  CC:  Chief Complaint  Patient presents with   Establish Care    HPI [name] presents to establish care I actually saw him at my old office, in [place].  He has osteoarthritis of bilateral knees and is status post TKR right.  He saw the orthopedist this morning about the osteoarthritis of his left knee.  They are doing a trial of Mobic to see how he does but he does have bone-on-bone osteoarthritis.  If Mobic is not effective they will be looking at hyaluronic acid injections. He takes multivitamin daily and turmeric for the arthritis in his knee.  He is allergic to shellfish, penicillins, glucosamine, clindamycin.  He does not take aspirin because of his diverticulosis. He reports no problems or concerns he is just trying to get established. Colonoscopy 10/19/2016 and had 2 type polyps, one of which was a TA.  Likely needs follow-up on his colonoscopy. There is no family history of prostate cancer.  But his sister and father both have hypertension. He has never been a smoker.  Drinks 3 beers over the weekend which is about normal for him.  He has no history of drug use.  Outpatient Encounter Medications as of 11/26/2023  Medication Sig   Multiple Vitamins-Minerals (MULTIVITAMIN ADULT PO) Take 1 tablet by mouth daily. Dr Sharlett vitamins    acetaminophen  (TYLENOL ) 500 MG tablet Take by mouth.   No facility-administered encounter medications on file as of 11/26/2023.    Past Medical History:  Diagnosis Date   Arthritis    knees   Asthma    as child   Diverticulitis of small intestine with perforation without abscess     Past Surgical History:  Procedure Laterality Date   COLONOSCOPY WITH PROPOFOL  N/A 10/19/2016   Procedure: COLONOSCOPY WITH PROPOFOL ;  Surgeon: Jinny Carmine, MD;  Location: [facility];  Service: Endoscopy;  Laterality: N/A;    HERNIA REPAIR     POLYPECTOMY  10/19/2016   Procedure: POLYPECTOMY;  Surgeon: Jinny Carmine, MD;  Location: [facility];  Service: Endoscopy;;   REPLACEMENT TOTAL KNEE Right 07/13/2016   [facility]   TONSILLECTOMY      Family History  Problem Relation Age of Onset   Healthy Mother    Healthy Father    Pancreatic cancer Maternal Grandmother    Diabetes Paternal Grandfather     Social History   Socioeconomic History   Marital status: Married    Spouse name: Not on file   Number of children: Not on file   Years of education: Not on file   Highest education level: Not on file  Occupational History   Not on file  Tobacco Use   Smoking status: Never    Passive exposure: Never   Smokeless tobacco: Never  Vaping Use   Vaping status: Never Used  Substance and Sexual Activity   Alcohol use: Yes    Comment: 6 Beers Weekly   Drug use: No   Sexual activity: Not on file  Other Topics Concern   Not on file  Social History Narrative   Not on file   Social Drivers of Health   Financial Resource Strain: Low Risk  (11/26/2023)   Received from [facility]   Overall Financial Resource Strain (CARDIA)    Difficulty of Paying Living Expenses: Not hard  at all  Food Insecurity: No Food Insecurity (11/26/2023)   Received from [facility]   Hunger Vital Sign    Within the past 12 months, you worried that your food would run out before you got the money to buy more.: Never true    Within the past 12 months, the food you bought just didn't last and you didn't have money to get more.: Never true  Transportation Needs: No Transportation Needs (11/26/2023)   Received from [facility] - Transportation    In the past 12 months, has lack of transportation kept you from medical appointments or from getting medications?: No    Lack of Transportation (Non-Medical): No  Physical Activity: Not on file  Stress: Not on  file  Social Connections: Not on file  Intimate Partner Violence: Not on file    ROS      Objective   BP (!) 146/87 (BP Location: Left Arm, Patient Position: Sitting, Cuff Size: Normal)   Pulse (!) 49   Temp 98.4 F (36.9 C) (Oral)   Resp 18   Ht 6' (1.829 m)   Wt 209 lb (94.8 kg)   SpO2 98%   BMI 28.35 kg/m    Physical Exam Vitals and nursing note reviewed.  Constitutional:      Appearance: Normal appearance.  HENT:     Head: Normocephalic and atraumatic.  Eyes:     Conjunctiva/sclera: Conjunctivae normal.  Cardiovascular:     Rate and Rhythm: Normal rate and regular rhythm.  Pulmonary:     Effort: Pulmonary effort is normal.     Breath sounds: Normal breath sounds.  Musculoskeletal:     Right lower leg: No edema.     Left lower leg: No edema.  Skin:    General: Skin is warm and dry.  Neurological:     Mental Status: He is alert and oriented to person, place, and time.  Psychiatric:        Mood and Affect: Mood normal.        Behavior: Behavior normal.        Thought Content: Thought content normal.        Judgment: Judgment normal.            The ASCVD Risk score (Arnett DK, et al., 2019) failed to calculate for the following reasons:   Cannot find a previous HDL lab   Cannot find a previous total cholesterol lab     Assessment & Plan:  Elevated blood pressure reading Assessment & Plan: Ask him to check his blood pressure when above the elbow cuff after being seated for about 10 minutes with his feet flat on the floor.  Gave him a log.  Check a couple of times a week and follow-up in a month see what his blood pressures have been running.     Return in about 4 weeks (around 12/24/2023).   Ayushi Pla K Kyani Simkin, MD

## 2023-11-26 NOTE — Assessment & Plan Note (Addendum)
 Ask him to check his blood pressure when above the elbow cuff after being seated for about 10 minutes with his feet flat on the floor.  Gave him a log.  Check a couple of times a week and follow-up in a month see what his blood pressures have been running.

## 2023-12-27 ENCOUNTER — Ambulatory Visit (INDEPENDENT_AMBULATORY_CARE_PROVIDER_SITE_OTHER): Payer: Self-pay | Admitting: Family Medicine

## 2023-12-27 ENCOUNTER — Telehealth: Payer: Self-pay | Admitting: Family Medicine

## 2023-12-27 ENCOUNTER — Encounter: Payer: Self-pay | Admitting: Family Medicine

## 2023-12-27 ENCOUNTER — Telehealth: Payer: Self-pay

## 2023-12-27 VITALS — BP 151/81 | HR 55 | Temp 98.5°F | Resp 18 | Ht 72.0 in | Wt 204.0 lb

## 2023-12-27 DIAGNOSIS — Z91013 Allergy to seafood: Secondary | ICD-10-CM | POA: Insufficient documentation

## 2023-12-27 DIAGNOSIS — R001 Bradycardia, unspecified: Secondary | ICD-10-CM | POA: Insufficient documentation

## 2023-12-27 DIAGNOSIS — Z0000 Encounter for general adult medical examination without abnormal findings: Secondary | ICD-10-CM

## 2023-12-27 DIAGNOSIS — E559 Vitamin D deficiency, unspecified: Secondary | ICD-10-CM

## 2023-12-27 DIAGNOSIS — R030 Elevated blood-pressure reading, without diagnosis of hypertension: Secondary | ICD-10-CM

## 2023-12-27 MED ORDER — EPINEPHRINE 0.3 MG/0.3ML IJ SOAJ: 0.3000 mg | INTRAMUSCULAR | 1 refills | Status: DC | PRN

## 2023-12-27 NOTE — Telephone Encounter (Signed)
 Copied from CRM #[id number]. Topic: General - Other >> Dec 27, 2023  9:35 AM Emylou G wrote: Reason for CRM: Patient called.. said Dr Tod left a message?  No [email or web address].Peter Hicks Pls call

## 2023-12-27 NOTE — Assessment & Plan Note (Addendum)
 Has elevated blood pressure readings.  Wife is an Charity fundraiser.  Working on low salt diet. Exercise routinely

## 2023-12-27 NOTE — Telephone Encounter (Signed)
 Called patient and asked him to come back in office today if possible and get an EKG because he has bradycardia.  Patent agreed.

## 2023-12-27 NOTE — Addendum Note (Signed)
 Addended by: Sheffield Hawker A on: 12/27/2023 03:28 PM   Modules accepted: Orders

## 2023-12-27 NOTE — Assessment & Plan Note (Signed)
 He is asymptomatic, just noticed low heart rate because he is checking his blood pressure.   Will check his EKG

## 2023-12-27 NOTE — Progress Notes (Addendum)
 Complete physical exam  Patient: Peter Hicks   DOB: 03/19/1961   63 y.o. Male  MRN: [id number]  Subjective:    Chief Complaint  Patient presents with  . Annual Exam    Peter Hicks is a 63 y.o. male who presents today for a complete physical exam. He reports consuming a low sodium diet. The patient has a physically strenuous job, but has no regular exercise apart from work.  He generally feels fairly well. He reports sleeping fairly well. He does not have additional problems to discuss today.   He has a hemorrhoid and declines a prostate check today. He request an epi-pen because he is allergic to shrimp.    Most recent fall risk assessment:    12/27/2023    8:10 AM  Fall Risk   Falls in the past year? 0  Number falls in past yr: 0  Injury with Fall? 0  Risk for fall due to : No Fall Risks  Follow up Falls evaluation completed     Most recent depression screenings:    12/27/2023    8:11 AM  PHQ 2/9 Scores  PHQ - 2 Score 0  PHQ- 9 Score 0        Patient Care Team: Kiril Hippe K, MD as PCP - General (Family Medicine)   Outpatient Medications Prior to Visit  Medication Sig  . acetaminophen  (TYLENOL ) 500 MG tablet Take by mouth.  . Multiple Vitamins-Minerals (MULTIVITAMIN ADULT PO) Take 1 tablet by mouth daily. Dr Sharlett vitamins    No facility-administered medications prior to visit.    ROS     Objective:    BP (!) 151/81   Pulse (!) 55   Temp 98.5 F (36.9 C) (Oral)   Resp 18   Ht 6' (1.829 m)   Wt 204 lb (92.5 kg)   SpO2 98%   BMI 27.67 kg/m    Physical Exam Vitals and nursing note reviewed. Exam conducted with a chaperone present.  Constitutional:      Appearance: Normal appearance.  HENT:     Head: Normocephalic and atraumatic.  Eyes:     Conjunctiva/sclera: Conjunctivae normal.  Cardiovascular:     Rate and Rhythm: Normal rate and regular rhythm.  Pulmonary:     Effort: Pulmonary effort is normal.     Breath sounds:  Normal breath sounds.  Abdominal:     General: Abdomen is flat. Bowel sounds are normal.     Palpations: Abdomen is soft.     Tenderness: There is no abdominal tenderness.     Hernia: There is no hernia in the left inguinal area or right inguinal area.  Genitourinary:    Pubic Area: No rash.      Penis: Normal.      Testes: Normal.     Epididymis:     Right: Normal.     Left: Normal.  Musculoskeletal:     Right lower leg: No edema.     Left lower leg: No edema.  Skin:    General: Skin is warm and dry.  Neurological:     Mental Status: He is alert and oriented to person, place, and time.  Psychiatric:        Mood and Affect: Mood normal.        Behavior: Behavior normal.        Thought Content: Thought content normal.        Judgment: Judgment normal.      No results found for  any visits on 12/27/23.   The ASCVD Risk score (Arnett DK, et al., 2019) failed to calculate for the following reasons:   Cannot find a previous HDL lab   Cannot find a previous total cholesterol lab     Assessment & Plan:    Routine Health Maintenance and Physical Exam  Health Maintenance  Topic Date Due  . HIV Screening  Never done  . Hepatitis C Screening  Never done  . Pneumococcal Vaccine for age over 78 (1 of 1 - PCV) Never done  . Zoster (Shingles) Vaccine (1 of 2) Never done  . Colon Cancer Screening  10/19/2021  . COVID-19 Vaccine (1 - 2024-25 season) Never done  . Flu Shot  08/05/2024*  . DTaP/Tdap/Td vaccine (2 - Td or Tdap) 12/30/2024  . Hepatitis B Vaccine  Aged Out  . HPV Vaccine  Aged Out  . Meningitis B Vaccine  Aged Out  *Topic was postponed. The date shown is not the original due date.    Discussed health benefits of physical activity, and encouraged him to engage in regular exercise appropriate for his age and condition.  Elevated blood pressure reading Assessment & Plan: Has elevated blood pressure readings.  Wife is an Charity fundraiser.  Working on low salt diet. Exercise  routinely   Wellness examination -     Lipid panel -     PSA -     TSH -     Comprehensive metabolic panel with GFR -     CBC with Differential/Platelet  Vitamin D  deficiency -     VITAMIN D  25 Hydroxy (Vit-D Deficiency, Fractures)  Allergy to shrimp Assessment & Plan: He request an epi-pen.    Orders: -     EPINEPHrine ; Inject 0.3 mg into the muscle as needed for anaphylaxis.  Dispense: 1 each; Refill: 1  Bradycardia Assessment & Plan: He is asymptomatic, just noticed low heart rate because he is checking his blood pressure.   Will check his EKG     Return in about 3 months (around 03/28/2024) for BP recheck.     Juris Gosnell K Ferman Basilio, MD

## 2023-12-27 NOTE — Addendum Note (Signed)
 Addended by: Rhythm Gubbels K on: 12/27/2023 09:01 AM   Modules accepted: Orders

## 2023-12-27 NOTE — Assessment & Plan Note (Signed)
 He request an epi-pen.

## 2023-12-28 ENCOUNTER — Ambulatory Visit: Payer: Self-pay | Admitting: Family Medicine

## 2023-12-28 ENCOUNTER — Ambulatory Visit: Admitting: Family Medicine

## 2023-12-28 LAB — COMPREHENSIVE METABOLIC PANEL WITH GFR
ALT: 21 IU/L (ref 0–44)
AST: 28 IU/L (ref 0–40)
Albumin: 4.3 g/dL (ref 3.9–4.9)
Alkaline Phosphatase: 52 IU/L (ref 44–121)
BUN/Creatinine Ratio: 15 (ref 10–24)
BUN: 18 mg/dL (ref 8–27)
Bilirubin Total: 1.5 mg/dL — ABNORMAL HIGH (ref 0.0–1.2)
CO2: 20 mmol/L (ref 20–29)
Calcium: 9.1 mg/dL (ref 8.6–10.2)
Chloride: 103 mmol/L (ref 96–106)
Creatinine, Ser: 1.17 mg/dL (ref 0.76–1.27)
Globulin, Total: 2.3 g/dL (ref 1.5–4.5)
Glucose: 98 mg/dL (ref 70–99)
Potassium: 4.3 mmol/L (ref 3.5–5.2)
Sodium: 140 mmol/L (ref 134–144)
Total Protein: 6.6 g/dL (ref 6.0–8.5)
eGFR: 70 mL/min/1.73 (ref >59)

## 2023-12-28 LAB — PSA: Prostate Specific Ag, Serum: 1.4 ng/mL (ref 0.0–4.0)

## 2023-12-28 LAB — CBC WITH DIFFERENTIAL/PLATELET
Basophils Absolute: 0.1 x10E3/uL (ref 0.0–0.2)
Basos: 2 %
EOS (ABSOLUTE): 0.2 x10E3/uL (ref 0.0–0.4)
Eos: 4 %
Hematocrit: 43.8 % (ref 37.5–51.0)
Hemoglobin: 14.2 g/dL (ref 13.0–17.7)
Immature Grans (Abs): 0 x10E3/uL (ref 0.0–0.1)
Immature Granulocytes: 0 %
Lymphocytes Absolute: 1.8 x10E3/uL (ref 0.7–3.1)
Lymphs: 43 %
MCH: 32.6 pg (ref 26.6–33.0)
MCHC: 32.4 g/dL (ref 31.5–35.7)
MCV: 101 fL — ABNORMAL HIGH (ref 79–97)
Monocytes Absolute: 0.3 x10E3/uL (ref 0.1–0.9)
Monocytes: 8 %
Neutrophils Absolute: 1.8 x10E3/uL (ref 1.4–7.0)
Neutrophils: 43 %
Platelets: 275 x10E3/uL (ref 150–450)
RBC: 4.36 x10E6/uL (ref 4.14–5.80)
RDW: 11.8 % (ref 11.6–15.4)
WBC: 4.2 x10E3/uL (ref 3.4–10.8)

## 2023-12-28 LAB — LIPID PANEL
Chol/HDL Ratio: 2.9 ratio (ref 0.0–5.0)
Cholesterol, Total: 155 mg/dL (ref 100–199)
HDL: 54 mg/dL (ref >39)
LDL Chol Calc (NIH): 85 mg/dL (ref 0–99)
Triglycerides: 82 mg/dL (ref 0–149)
VLDL Cholesterol Cal: 16 mg/dL (ref 5–40)

## 2023-12-28 LAB — VITAMIN D 25 HYDROXY (VIT D DEFICIENCY, FRACTURES): Vit D, 25-Hydroxy: 56.8 ng/mL (ref 30.0–100.0)

## 2023-12-28 LAB — TSH: TSH: 2.39 u[IU]/mL (ref 0.450–4.500)

## 2023-12-31 ENCOUNTER — Other Ambulatory Visit: Payer: Self-pay

## 2023-12-31 DIAGNOSIS — Z91013 Allergy to seafood: Secondary | ICD-10-CM

## 2023-12-31 NOTE — Telephone Encounter (Signed)
 Fax from [facility] requested Epi pen be ordered in quantity of 2

## 2024-01-02 ENCOUNTER — Other Ambulatory Visit: Payer: Self-pay | Admitting: Family Medicine

## 2024-01-02 DIAGNOSIS — Z91013 Allergy to seafood: Secondary | ICD-10-CM

## 2024-01-02 MED ORDER — EPINEPHRINE 0.3 MG/0.3ML IJ SOAJ: 0.3000 mg | INTRAMUSCULAR | 1 refills | Status: AC | PRN

## 2024-03-28 ENCOUNTER — Ambulatory Visit: Payer: Self-pay | Attending: Family Medicine

## 2024-03-28 ENCOUNTER — Ambulatory Visit (INDEPENDENT_AMBULATORY_CARE_PROVIDER_SITE_OTHER): Payer: Self-pay | Admitting: Family Medicine

## 2024-03-28 ENCOUNTER — Encounter: Payer: Self-pay | Admitting: Family Medicine

## 2024-03-28 VITALS — BP 151/102 | HR 61 | Temp 99.0°F | Resp 18 | Ht 72.0 in | Wt 203.0 lb

## 2024-03-28 DIAGNOSIS — R17 Unspecified jaundice: Secondary | ICD-10-CM

## 2024-03-28 DIAGNOSIS — D7589 Other specified diseases of blood and blood-forming organs: Secondary | ICD-10-CM

## 2024-03-28 DIAGNOSIS — R001 Bradycardia, unspecified: Secondary | ICD-10-CM

## 2024-03-28 DIAGNOSIS — R030 Elevated blood-pressure reading, without diagnosis of hypertension: Secondary | ICD-10-CM

## 2024-03-28 NOTE — Progress Notes (Signed)
 Established Patient Office Visit  Subjective   Patient ID: Peter Hicks, male    DOB: August 15, 1960  Age: 63 y.o. MRN: [id number]  Chief Complaint  Patient presents with   Hypertension    HPI Peter Hicks 63 year old gentleman with allergy to shrimp, bradycardia (40s), elevated blood pressure readings, macrocytosis, elevated total bilirubin, he does not receive the flu vaccine.    Reviewed his labs with him.  He has an MCV of 101 and reports that he drinks 3-4 beers a week. His heart rate is 49 and he is asymptomatic with no diaphoresis, nausea, chest pains, shortness of breath or dizziness.  He has been told in the past that he had bradycardia.  Last EKG showed sinus bradycardia without ST or T wave changes suggestive of ischemia. His total bilirubin is 1.5.  Not aware of ever being told he had Gilbert's syndrome. Total cholesterol 155, Triggs 82, HDL 54, LDL 85.  10-year ASCVD risk is 12.3 but this is being influenced by his elevated blood pressure of 151/102.          Objective:     BP (!) 151/102 (BP Location: Left Arm, Patient Position: Sitting, Cuff Size: Normal)   Pulse 61   Temp 99 F (37.2 C) (Oral)   Resp 18   Ht 6' (1.829 m)   Wt 203 lb (92.1 kg)   SpO2 98%   BMI 27.53 kg/m    Physical Exam Vitals and nursing note reviewed.  Constitutional:      Appearance: Normal appearance.  HENT:     Head: Normocephalic and atraumatic.  Eyes:     Conjunctiva/sclera: Conjunctivae normal.  Cardiovascular:     Rate and Rhythm: Normal rate and regular rhythm.  Pulmonary:     Effort: Pulmonary effort is normal.     Breath sounds: Normal breath sounds.  Musculoskeletal:     Right lower leg: No edema.     Left lower leg: No edema.  Skin:    General: Skin is warm and dry.  Neurological:     Mental Status: He is alert and oriented to person, place, and time.  Psychiatric:        Mood and Affect: Mood normal.        Behavior: Behavior normal.        Thought Content:  Thought content normal.        Judgment: Judgment normal.          Results for orders placed or performed in visit on 03/28/24  Bilirubin, fractionated (tot/dir/indir)  Result Value Ref Range   Bilirubin Total 2.0 (H) 0.0 - 1.2 mg/dL   Bilirubin, Direct 9.52 (H) 0.00 - 0.40 mg/dL   Bilirubin, Indirect 8.46 (H) 0.10 - 0.80 mg/dL  Vitamin B12  Result Value Ref Range   Vitamin B-12 807 232 - 1,245 pg/mL  Folate  Result Value Ref Range   Folate >20.0 >3.0 ng/mL  Methylmalonic Acid  Result Value Ref Range   Methylmalonic Acid 119 0 - 378 nmol/L      The 10-year ASCVD risk score (Arnett DK, et al., 2019) is: 12.3%    Assessment & Plan:  Elevated bilirubin Assessment & Plan: Does not remember ever being told he has Gill Bears syndrome.  Has elevated bilirubin.  Will check bilirubin fractionation.  Orders: -     Bilirubin, fractionated(tot/dir/indir)  Macrocytosis without anemia -     Vitamin B12 -     Folate -     Methylmalonic acid, serum  Bradycardia Assessment & Plan: Heart rate dropped into the 40s during the day.  Need to check not heart rate.  Will assign Zio patch for 2 weeks.  Orders: -     LONG TERM MONITOR (3-14 DAYS); Future  Elevated blood pressure reading Assessment & Plan: He reports that his wife is an [employer] and she checks his blood pressure and it is normal at home but he did not bring any records today.  Recheck of his blood pressure is 161/95.  He eats healthy with a low-salt diet and has a strenuous job.  Never has any chest pains or shortness of breath with work.  Please continue to check your blood pressure randomly at home after rest for 5 minutes.  Please bring your records with you.   Macrocytosis Assessment & Plan: MCV was 101.  Drinks 3-4 beers a week.  Will check B12, methylmalonic acid and folate.      Return in about 1 year (around 03/28/2025).    Peter Nussbaumer K Katharin Schneider, MD

## 2024-04-03 LAB — FOLATE: Folate: >20 ng/mL (ref >3.0)

## 2024-04-03 LAB — BILIRUBIN, FRACTIONATED(TOT/DIR/INDIR)
Bilirubin Total: 2 mg/dL — ABNORMAL HIGH (ref 0.0–1.2)
Bilirubin, Direct: 0.47 mg/dL — ABNORMAL HIGH (ref 0.00–0.40)
Bilirubin, Indirect: 1.53 mg/dL — ABNORMAL HIGH (ref 0.10–0.80)

## 2024-04-03 LAB — VITAMIN B12: Vitamin B-12: 807 pg/mL (ref 232–1245)

## 2024-04-03 LAB — METHYLMALONIC ACID, SERUM: Methylmalonic Acid: 119 nmol/L (ref 0–378)

## 2024-04-05 DIAGNOSIS — D7589 Other specified diseases of blood and blood-forming organs: Secondary | ICD-10-CM | POA: Insufficient documentation

## 2024-04-05 DIAGNOSIS — R17 Unspecified jaundice: Secondary | ICD-10-CM | POA: Insufficient documentation

## 2024-04-05 NOTE — Assessment & Plan Note (Signed)
 Heart rate dropped into the 40s during the day.  Need to check not heart rate.  Will assign Zio patch for 2 weeks.

## 2024-04-05 NOTE — Assessment & Plan Note (Signed)
 He reports that his wife is an [employer] and she checks his blood pressure and it is normal at home but he did not bring any records today.  Recheck of his blood pressure is 161/95.  He eats healthy with a low-salt diet and has a strenuous job.  Never has any chest pains or shortness of breath with work.  Please continue to check your blood pressure randomly at home after rest for 5 minutes.  Please bring your records with you.

## 2024-04-05 NOTE — Assessment & Plan Note (Signed)
 MCV was 101.  Drinks 3-4 beers a week.  Will check B12, methylmalonic acid and folate.

## 2024-04-05 NOTE — Assessment & Plan Note (Signed)
 Does not remember ever being told he has Gill Bears syndrome.  Has elevated bilirubin.  Will check bilirubin fractionation.

## 2024-04-07 ENCOUNTER — Ambulatory Visit: Payer: Self-pay | Admitting: Family Medicine

## 2024-04-17 ENCOUNTER — Other Ambulatory Visit: Payer: Self-pay | Admitting: Cardiology

## 2024-04-17 ENCOUNTER — Telehealth: Payer: Self-pay | Admitting: Family Medicine

## 2024-04-17 DIAGNOSIS — R9431 Abnormal electrocardiogram [ECG] [EKG]: Secondary | ICD-10-CM

## 2024-04-17 NOTE — Telephone Encounter (Signed)
 Called and LM that I wanted to talk with him about his cardiac monitor.

## 2024-04-18 ENCOUNTER — Telehealth: Payer: Self-pay | Admitting: Family Medicine

## 2024-04-18 NOTE — Telephone Encounter (Signed)
 Reviewed his Zio patch results with him.  He needs a referral to cardiac electrophysiology.  Advised to go to be calling for an appointment.  Answered his questions.

## 2024-05-04 DIAGNOSIS — I479 Paroxysmal tachycardia, unspecified: Secondary | ICD-10-CM | POA: Insufficient documentation

## 2024-05-04 NOTE — Progress Notes (Unsigned)
 Cardiology Office Note  Date:  05/06/2024   ID:  Peter Hicks, Peter Hicks 04-12-1961, MRN [id number]  PCP:  Ziglar, Susan K, MD   Chief Complaint  Patient presents with   New Patient (Initial Visit)    Ref by Dr. Fonda Kitty for abnormal holter monitor showing SVT. Patient denies any cardiac concerns or issues.     HPI:  Peter Hicks is a 63 y.o. male with past medical history of: Past Medical History:  Diagnosis Date   Arthritis    knees   Asthma    as child   Diverticulitis of small intestine with perforation without abscess   History of bradycardia Who presents by referral from Dr. Kitty for consultation of his SVT, abnormal Holter monitor  Recently seen by Dr. Brice, Holter monitor ordered for sinus bradycardia He reports that he is asymptomatic from his bradycardia  Zio monitor results pulled up and reviewed on visit today Dated April 17, 2024 HR 39 - 226, average 62 bpm. 18 nonsustained SVT (longest 13 beats)  5 nonsustained VT (longest 8 beats). No atrial fibrillation detected. Rare supraventricular ectopy. Occasional ventricular ectopy, 2.4%. No symptom trigger episodes.   CT abdomen pelvis January 2018 No significant aortic atherosclerosis Images pulled up and reviewed  Lab work reviewed Total cholesterol 155 LDL 85 Non-smoker, no diabetes  EKG personally reviewed by myself on todays visit EKG Interpretation Date/Time:  Tuesday May 06 2024 11:21:09 EST Ventricular Rate:  58 PR Interval:  168 QRS Duration:  98 QT Interval:  430 QTC Calculation: 422 R Axis:   -30  Text Interpretation: Sinus bradycardia with Premature atrial complexes Left axis deviation When compared with ECG of 05-Jun-2016 18:12, No significant change was found Confirmed by Perla Lye [id number]) on 05/06/2024 11:46:38 AM     PMH:   has a past medical history of Arthritis, Asthma, and Diverticulitis of small intestine with perforation without abscess.   PSH:     Past Surgical History:  Procedure Laterality Date   COLONOSCOPY WITH PROPOFOL  N/A 10/19/2016   Procedure: COLONOSCOPY WITH PROPOFOL ;  Surgeon: Jinny Carmine, MD;  Location[facility];  Service: Endoscopy;  Laterality: N/A;   HERNIA REPAIR     POLYPECTOMY  10/19/2016   Procedure: POLYPECTOMY;  Surgeon: Jinny Carmine, MD;  Location: [facility];  Service: Endoscopy;;   REPLACEMENT TOTAL KNEE Right 07/13/2016   [facility]   TONSILLECTOMY      Current Outpatient Medications  Medication Sig Dispense Refill   acetaminophen  (TYLENOL ) 500 MG tablet Take by mouth.     EPINEPHrine  0.3 mg/0.3 mL IJ SOAJ injection Inject 0.3 mg into the muscle as needed for anaphylaxis. 2 each 1   Multiple Vitamins-Minerals (MULTIVITAMIN ADULT PO) Take 1 tablet by mouth daily. Dr Sharlett vitamins      No current facility-administered medications for this visit.    Allergies:   Aspirin, Cleocin [clindamycin hcl], Glucosamine, Nsaids, Penicillins, Shellfish protein-containing drug products, Tolmetin, and Sulfa antibiotics   Social History:  The patient  reports that he has never smoked. He has never been exposed to tobacco smoke. He has never used smokeless tobacco. He reports current alcohol use. He reports that he does not use drugs.   Family History:   family history includes Dementia in his mother; Diabetes in his father and paternal grandfather; Pancreatic cancer in his maternal grandmother.    Review of Systems: Review of Systems  Constitutional: Negative.   HENT: Negative.    Respiratory: Negative.  Cardiovascular: Negative.   Gastrointestinal: Negative.   Musculoskeletal: Negative.   Neurological: Negative.   Psychiatric/Behavioral: Negative.    All other systems reviewed and are negative.   PHYSICAL EXAM: VS:  BP (!) 156/90 (BP Location: Right Arm, Patient Position: Sitting, Cuff Size: Normal)   Pulse (!) 58   Ht 5' 11 (1.803 m)   Wt 206 lb 8 oz (93.7 kg)   SpO2  100%   BMI 28.80 kg/m  , BMI Body mass index is 28.8 kg/m. GEN: Well nourished, well developed, in no acute distress HEENT: normal Neck: no JVD, carotid bruits, or masses Cardiac: RRR; no murmurs, rubs, or gallops,no edema  Respiratory:  clear to auscultation bilaterally, normal work of breathing GI: soft, nontender, nondistended, + BS MS: no deformity or atrophy Skin: warm and dry, no rash Neuro:  Strength and sensation are intact Psych: euthymic mood, full affect   Recent Labs: 12/27/2023: ALT 21; BUN 18; Creatinine, Ser 1.17; Hemoglobin 14.2; Platelets 275; Potassium 4.3; Sodium 140; TSH 2.390    Lipid Panel Lab Results  Component Value Date   CHOL 155 12/27/2023   HDL 54 12/27/2023   LDLCALC 85 12/27/2023   TRIG 82 12/27/2023      Wt Readings from Last 3 Encounters:  05/06/24 206 lb 8 oz (93.7 kg)  03/28/24 203 lb (92.1 kg)  12/27/23 204 lb (92.5 kg)       ASSESSMENT AND PLAN:  Problem List Items Addressed This Visit       Cardiology Problems   Paroxysmal tachycardia (HCC) - Primary   Relevant Orders   EKG 12-Lead (Completed)     Other   Elevated blood pressure reading   Relevant Orders   EKG 12-Lead (Completed)   Elevated bilirubin    Paroxysmal tachycardia/SVT Noted on Holter monitor Asymptomatic, prefers not to be on medications Given baseline bradycardia, no medication started at this time Episodes of tachycardia are short-lived, several seconds in duration, likely of little clinical significance  PVCs Low burden of PVCs, asymptomatic Given baseline bradycardia, will hold off on beta-blockers - Given asymptomatic with a few cardiac risk factors and normal clinical exam and EKG on today's visit, no strong indication for cardiac studies such as echocardiogram or stress testing  Hypertension Blood pressure elevated, even on repeat -Recommend close monitoring of blood pressure at home If numbers elevated, may need to initiate medication  Seen  in consultation for Dr. Ziglar and will be referred back to her office for ongoing care of the issues detailed above  Signed, Velinda Lunger, M.D., Ph.D. [facility] [place], [place] [phone]

## 2024-05-06 ENCOUNTER — Ambulatory Visit: Payer: PRIVATE HEALTH INSURANCE | Attending: Cardiovascular Disease | Admitting: Cardiovascular Disease

## 2024-05-06 ENCOUNTER — Encounter: Payer: Self-pay | Admitting: Cardiovascular Disease

## 2024-05-06 VITALS — BP 156/90 | HR 58 | Ht 71.0 in | Wt 206.5 lb

## 2024-05-06 DIAGNOSIS — R17 Unspecified jaundice: Secondary | ICD-10-CM | POA: Diagnosis not present

## 2024-05-06 DIAGNOSIS — R030 Elevated blood-pressure reading, without diagnosis of hypertension: Secondary | ICD-10-CM

## 2024-05-06 DIAGNOSIS — I479 Paroxysmal tachycardia, unspecified: Secondary | ICD-10-CM

## 2024-05-06 NOTE — Patient Instructions (Addendum)
 Please monitor blood pressure at home   Medication Instructions:  No changes  If you need a refill on your cardiac medications before your next appointment, please call your pharmacy.   Lab work: No new labs needed  Testing/Procedures: No new testing needed  Follow-Up: At [facility], you and your health needs are our priority.  As part of our continuing mission to provide you with exceptional heart care, we have created designated Provider Care Teams.  These Care Teams include your primary Cardiologist (physician) and Advanced Practice Providers (APPs -  Physician Assistants and Nurse Practitioners) who all work together to provide you with the care you need, when you need it.  You will need a follow up appointment as needed BP log given  Providers on your designated Care Team:   Lonni Meager, NP Bernardino Bring, PA-C Cadence [place], [place]  COVID-19 Vaccine Information can be found at: [email or web address] For questions related to vaccine distribution or appointments, please email [email or web address] or call [phone].

## 2025-03-27 ENCOUNTER — Ambulatory Visit: Payer: Self-pay | Admitting: Family Medicine
